# Patient Record
Sex: Female | Born: 1985 | Race: Black or African American | Hispanic: No | Marital: Married | Smoking: Never smoker
Health system: Southern US, Community
[De-identification: ages and names within clinical notes are randomized; demographics above are authoritative.]

## PROBLEM LIST (undated history)

## (undated) ENCOUNTER — Inpatient Hospital Stay (HOSPITAL_COMMUNITY): Payer: Self-pay

## (undated) DIAGNOSIS — D219 Benign neoplasm of connective and other soft tissue, unspecified: Secondary | ICD-10-CM

## (undated) HISTORY — PX: NO PAST SURGERIES: SHX2092

---
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
## 2013-02-20 NOTE — L&D Delivery Note (Signed)
Pt. Progressed to full dilation after AROM and pitocin augmentation.  Fetal bradycardia at the beginning of pushing resolved.  NSVD of a viable female, LOA over intact perineum.  Loose nuchal cord released over head. Shoulders delivered with gentle traction.  Meconium in fluid noted.  Neonate handed over to NICU team quickly after cord being cut.  Cord PH and cord blood collected.  Placenta delivered with gentle traction, appears grossly normal with 3 vessel cord. Left labial minora laceration repaired with 3-0 vicryl.  No other lacerations noted.  Neonate taken to NICU in stable condition, Apgars 3,7.  EBL 400cc. Pitocin given after placenta delivery.  Mother stable in labor room after delivery.  Dr. Alesia Richards.

## 2013-03-12 ENCOUNTER — Ambulatory Visit: Payer: Self-pay | Admitting: Obstetrics & Gynecology

## 2013-03-13 ENCOUNTER — Ambulatory Visit: Payer: Self-pay | Admitting: Obstetrics

## 2013-03-31 ENCOUNTER — Encounter: Payer: Self-pay | Admitting: Obstetrics & Gynecology

## 2013-03-31 ENCOUNTER — Ambulatory Visit: Payer: Self-pay | Admitting: Obstetrics & Gynecology

## 2013-03-31 ENCOUNTER — Ambulatory Visit (INDEPENDENT_AMBULATORY_CARE_PROVIDER_SITE_OTHER): Payer: BC Managed Care – PPO | Admitting: Obstetrics & Gynecology

## 2013-03-31 VITALS — BP 138/80 | HR 75 | Temp 98.4°F | Ht 62.0 in | Wt 186.0 lb

## 2013-03-31 DIAGNOSIS — N926 Irregular menstruation, unspecified: Secondary | ICD-10-CM

## 2013-03-31 LAB — POCT URINE PREGNANCY: Preg Test, Ur: POSITIVE

## 2013-03-31 NOTE — Progress Notes (Signed)
Subjective:     Stephanie Park is a 28 y.o. female here for a routine exam.  Current complaints: Patient in office today for a problem visit. Patient states her last period was on December 12, 2012. Patient states she had some spotting Sunday, Tuesday, and Friday. Patient states periods are usually regular. Patient states period came twice in October once on the 6th and then again on the 23. On the 6th it only lasted 3 days and then on the 23rd which is when her normal periods usually come it was alittle longer then normal lasting 5-6 days. Patient states she would like to talk about preparing for pregnancy.  Personal health questionnaire reviewed: yes.   Gynecologic History Patient's last menstrual period was 12/12/2012. Contraception: none Last Pap: 2013. Results were: normal  Obstetric History OB History  Gravida Para Term Preterm AB SAB TAB Ectopic Multiple Living  0 0 0 0 0 0 0 0 0 0          The following portions of the patient's history were reviewed and updated as appropriate: allergies, current medications, past family history, past medical history, past social history, past surgical history and problem list.  Review of Systems Pertinent items are noted in HPI.    Objective:     Exam deferred     Assessment:   Early pregnant  Plan:   PNV Orders Placed This Encounter  Procedures  . hCG, quantitative, pregnancy  . POCT urine pregnancy  Return to start prenatal care

## 2013-04-01 LAB — HCG, QUANTITATIVE, PREGNANCY: HCG, BETA CHAIN, QUANT, S: 19174.5 m[IU]/mL

## 2013-04-07 ENCOUNTER — Other Ambulatory Visit: Payer: Self-pay | Admitting: *Deleted

## 2013-04-07 DIAGNOSIS — O3680X0 Pregnancy with inconclusive fetal viability, not applicable or unspecified: Secondary | ICD-10-CM

## 2013-04-08 ENCOUNTER — Other Ambulatory Visit: Payer: BC Managed Care – PPO

## 2013-04-11 ENCOUNTER — Ambulatory Visit (HOSPITAL_COMMUNITY)
Admission: RE | Admit: 2013-04-11 | Discharge: 2013-04-11 | Disposition: A | Discharge status: Home / Self Care | Payer: BC Managed Care – PPO | Source: Ambulatory Visit | Attending: Obstetrics & Gynecology | Admitting: Obstetrics & Gynecology

## 2013-04-11 DIAGNOSIS — O208 Other hemorrhage in early pregnancy: Secondary | ICD-10-CM | POA: Insufficient documentation

## 2013-04-11 DIAGNOSIS — O3680X0 Pregnancy with inconclusive fetal viability, not applicable or unspecified: Secondary | ICD-10-CM

## 2013-04-11 DIAGNOSIS — Z3689 Encounter for other specified antenatal screening: Secondary | ICD-10-CM | POA: Insufficient documentation

## 2013-04-28 ENCOUNTER — Encounter: Payer: Self-pay | Admitting: Obstetrics & Gynecology

## 2013-04-28 ENCOUNTER — Ambulatory Visit (INDEPENDENT_AMBULATORY_CARE_PROVIDER_SITE_OTHER): Payer: BC Managed Care – PPO | Admitting: Obstetrics & Gynecology

## 2013-04-28 VITALS — BP 118/79 | Temp 98.3°F | Wt 195.0 lb

## 2013-04-28 DIAGNOSIS — Z3201 Encounter for pregnancy test, result positive: Secondary | ICD-10-CM

## 2013-04-28 DIAGNOSIS — O26899 Other specified pregnancy related conditions, unspecified trimester: Secondary | ICD-10-CM

## 2013-04-28 DIAGNOSIS — D252 Subserosal leiomyoma of uterus: Secondary | ICD-10-CM

## 2013-04-28 DIAGNOSIS — R519 Headache, unspecified: Secondary | ICD-10-CM

## 2013-04-28 DIAGNOSIS — R51 Headache: Secondary | ICD-10-CM

## 2013-04-28 DIAGNOSIS — Z3400 Encounter for supervision of normal first pregnancy, unspecified trimester: Secondary | ICD-10-CM

## 2013-04-28 LAB — OB RESULTS CONSOLE GC/CHLAMYDIA
Chlamydia: NEGATIVE
Gonorrhea: NEGATIVE

## 2013-04-28 LAB — POCT URINALYSIS DIPSTICK
BILIRUBIN UA: NEGATIVE
GLUCOSE UA: NEGATIVE
Ketones, UA: NEGATIVE
Leukocytes, UA: NEGATIVE
Nitrite, UA: NEGATIVE
Protein, UA: NEGATIVE
RBC UA: NEGATIVE
UROBILINOGEN UA: NEGATIVE
pH, UA: 8

## 2013-04-28 MED ORDER — BUTALBITAL-APAP-CAFFEINE 50-325-40 MG PO TABS: 1.0000 | ORAL_TABLET | Freq: Four times a day (QID) | ORAL | Status: DC | PRN

## 2013-04-28 NOTE — Addendum Note (Signed)
Addended by: Lewie Loron D on: 04/28/2013 04:14 PM   Modules accepted: Orders

## 2013-04-28 NOTE — Patient Instructions (Signed)
Pregnancy - First Trimester  During sexual intercourse, millions of sperm go into the vagina. Only 1 sperm will penetrate and fertilize the female egg while it is in the Fallopian tube. One week later, the fertilized egg implants into the wall of the uterus. An embryo begins to develop into a baby. At 6 to 8 weeks, the eyes and face are formed and the heartbeat can be seen on ultrasound. At the end of 12 weeks (first trimester), all the baby's organs are formed. Now that you are pregnant, you will want to do everything you can to have a healthy baby. Two of the most important things are to get good prenatal care and follow your caregiver's instructions. Prenatal care is all the medical care you receive before the baby's birth. It is given to prevent, find, and treat problems during the pregnancy and childbirth.  PRENATAL EXAMS  · During prenatal visits, your weight, blood pressure, and urine are checked. This is done to make sure you are healthy and progressing normally during the pregnancy.  · A pregnant woman should gain 25 to 35 pounds during the pregnancy. However, if you are overweight or underweight, your caregiver will advise you regarding your weight.  · Your caregiver will ask and answer questions for you.  · Blood work, cervical cultures, other necessary tests, and a Pap test are done during your prenatal exams. These tests are done to check on your health and the probable health of your baby. Tests are strongly recommended and done for HIV with your permission. This is the virus that causes AIDS. These tests are done because medicines can be given to help prevent your baby from being born with this infection should you have been infected without knowing it. Blood work is also used to find out your blood type, previous infections, and follow your blood levels (hemoglobin).  · Low hemoglobin (anemia) is common during pregnancy. Iron and vitamins are given to help prevent this. Later in the pregnancy, blood  tests for diabetes will be done along with any other tests if any problems develop.  · You may need other tests to make sure you and the baby are doing well.  CHANGES DURING THE FIRST TRIMESTER   Your body goes through many changes during pregnancy. They vary from person to person. Talk to your caregiver about changes you notice and are concerned about. Changes can include:  · Your menstrual period stops.  · The egg and sperm carry the genes that determine what you look like. Genes from you and your partner are forming a baby. The female genes determine whether the baby is a boy or a girl.  · Your body increases in girth and you may feel bloated.  · Feeling sick to your stomach (nauseous) and throwing up (vomiting). If the vomiting is uncontrollable, call your caregiver.  · Your breasts will begin to enlarge and become tender.  · Your nipples may stick out more and become darker.  · The need to urinate more. Painful urination may mean you have a bladder infection.  · Tiring easily.  · Loss of appetite.  · Cravings for certain kinds of food.  · At first, you may gain or lose a couple of pounds.  · You may have changes in your emotions from day to day (excited to be pregnant or concerned something may go wrong with the pregnancy and baby).  · You may have more vivid and strange dreams.  HOME CARE INSTRUCTIONS   ·   It is very important to avoid all smoking, alcohol and non-prescribed drugs during your pregnancy. These affect the formation and growth of the baby. Avoid chemicals while pregnant to ensure the delivery of a healthy infant.  · Start your prenatal visits by the 12th week of pregnancy. They are usually scheduled monthly at first, then more often in the last 2 months before delivery. Keep your caregiver's appointments. Follow your caregiver's instructions regarding medicine use, blood and lab tests, exercise, and diet.  · During pregnancy, you are providing food for you and your baby. Eat regular, well-balanced  meals. Choose foods such as meat, fish, milk and other low fat dairy products, vegetables, fruits, and whole-grain breads and cereals. Your caregiver will tell you of the ideal weight gain.  · You can help morning sickness by keeping soda crackers at the bedside. Eat a couple before arising in the morning. You may want to use the crackers without salt on them.  · Eating 4 to 5 small meals rather than 3 large meals a day also may help the nausea and vomiting.  · Drinking liquids between meals instead of during meals also seems to help nausea and vomiting.  · A physical sexual relationship may be continued throughout pregnancy if there are no other problems. Problems may be early (premature) leaking of amniotic fluid from the membranes, vaginal bleeding, or belly (abdominal) pain.  · Exercise regularly if there are no restrictions. Check with your caregiver or physical therapist if you are unsure of the safety of some of your exercises. Greater weight gain will occur in the last 2 trimesters of pregnancy. Exercising will help:  · Control your weight.  · Keep you in shape.  · Prepare you for labor and delivery.  · Help you lose your pregnancy weight after you deliver your baby.  · Wear a good support or jogging bra for breast tenderness during pregnancy. This may help if worn during sleep too.  · Ask when prenatal classes are available. Begin classes when they are offered.  · Do not use hot tubs, steam rooms, or saunas.  · Wear your seat belt when driving. This protects you and your baby if you are in an accident.  · Avoid raw meat, uncooked cheese, cat litter boxes, and soil used by cats throughout the pregnancy. These carry germs that can cause birth defects in the baby.  · The first trimester is a good time to visit your dentist for your dental health. Getting your teeth cleaned is okay. Use a softer toothbrush and brush gently during pregnancy.  · Ask for help if you have financial, counseling, or nutritional needs  during pregnancy. Your caregiver will be able to offer counseling for these needs as well as refer you for other special needs.  · Do not take any medicines or herbs unless told by your caregiver.  · Inform your caregiver if there is any mental or physical domestic violence.  · Make a list of emergency phone numbers of family, friends, hospital, and police and fire departments.  · Write down your questions. Take them to your prenatal visit.  · Do not douche.  · Do not cross your legs.  · If you have to stand for long periods of time, rotate you feet or take small steps in a circle.  · You may have more vaginal secretions that may require a sanitary pad. Do not use tampons or scented sanitary pads.  MEDICINES AND DRUG USE IN PREGNANCY  ·   Take prenatal vitamins as directed. The vitamin should contain 1 milligram of folic acid. Keep all vitamins out of reach of children. Only a couple vitamins or tablets containing iron may be fatal to a baby or young child when ingested.  · Avoid use of all medicines, including herbs, over-the-counter medicines, not prescribed or suggested by your caregiver. Only take over-the-counter or prescription medicines for pain, discomfort, or fever as directed by your caregiver. Do not use aspirin, ibuprofen, or naproxen unless directed by your caregiver.  · Let your caregiver also know about herbs you may be using.  · Alcohol is related to a number of birth defects. This includes fetal alcohol syndrome. All alcohol, in any form, should be avoided completely. Smoking will cause low birth rate and premature babies.  · Street or illegal drugs are very harmful to the baby. They are absolutely forbidden. A baby born to an addicted mother will be addicted at birth. The baby will go through the same withdrawal an adult does.  · Let your caregiver know about any medicines that you have to take and for what reason you take them.  SEEK MEDICAL CARE IF:   You have any concerns or worries during your  pregnancy. It is better to call with your questions if you feel they cannot wait, rather than worry about them.  SEEK IMMEDIATE MEDICAL CARE IF:   · An unexplained oral temperature above 102° F (38.9° C) develops, or as your caregiver suggests.  · You have leaking of fluid from the vagina (birth canal). If leaking membranes are suspected, take your temperature and inform your caregiver of this when you call.  · There is vaginal spotting or bleeding. Notify your caregiver of the amount and how many pads are used.  · You develop a bad smelling vaginal discharge with a change in the color.  · You continue to feel sick to your stomach (nauseated) and have no relief from remedies suggested. You vomit blood or coffee ground-like materials.  · You lose more than 2 pounds of weight in 1 week.  · You gain more than 2 pounds of weight in 1 week and you notice swelling of your face, hands, feet, or legs.  · You gain 5 pounds or more in 1 week (even if you do not have swelling of your hands, face, legs, or feet).  · You get exposed to German measles and have never had them.  · You are exposed to fifth disease or chickenpox.  · You develop belly (abdominal) pain. Round ligament discomfort is a common non-cancerous (benign) cause of abdominal pain in pregnancy. Your caregiver still must evaluate this.  · You develop headache, fever, diarrhea, pain with urination, or shortness of breath.  · You fall or are in a car accident or have any kind of trauma.  · There is mental or physical violence in your home.  Document Released: 01/31/2001 Document Revised: 11/01/2011 Document Reviewed: 08/04/2008  Exit[employer]® Patient Information ©2014 [employer].

## 2013-04-28 NOTE — Progress Notes (Signed)
Subjective:    Stephanie Park is being seen today for her first obstetrical visit.  This is a planned pregnancy. She is at  [redacted]w[redacted]d gestation. Her obstetrical history is significant for This is the Patients first pregnancy. . Relationship with FOB: spouse, living together. Patient does intend to breast feed. Pregnancy history fully reviewed. Patient states she had an ultrasound done almost 3 weeks ago and would like to know the results and how far along she is. Patient states she has been having constant headaches almost everyday. Patient states that off and on she will having some cramping pain and bloating.  Pulse: 89  Menstrual History: OB History   Grav Para Term Preterm Abortions TAB SAB Ect Mult Living   1 0 0 0 0 0 0 0 0 0       Menarche age: 39  Patient's last menstrual period was 12/12/2012.    The following portions of the patient's history were reviewed and updated as appropriate: allergies, current medications, past family history, past medical history, past social history, past surgical history and problem list.  Review of Systems Pertinent items are noted in HPI.    Objective:      General Appearance:    Alert, cooperative, no distress, appears stated age  Head:    Normocephalic, without obvious abnormality, atraumatic  Eyes:    PERRL, conjunctiva/corneas clear, EOM's intact, fundi    benign, both eyes  Ears:    Normal TM's and external ear canals, both ears  Nose:   Nares normal, septum midline, mucosa normal, no drainage    or sinus tenderness  Throat:   Lips, mucosa, and tongue normal; teeth and gums normal  Neck:   Supple, symmetrical, trachea midline, no adenopathy;    thyroid:  no enlargement/tenderness/nodules; no carotid   bruit or JVD  Back:     Symmetric, no curvature, ROM normal, no CVA tenderness  Lungs:     Clear to auscultation bilaterally, respirations unlabored  Chest Wall:    No tenderness or deformity   Heart:    Regular rate and rhythm, S1 and S2  normal, no murmur, rub   or gallop  Breast Exam:    No tenderness, masses, or nipple abnormality  Abdomen:     Soft, non-tender, bowel sounds active all four quadrants,    no masses, no organomegaly  Genitalia:    Borderline clitoral index/clitorimegaly, discharge or tenderness  Extremities:   Extremities normal, atraumatic, no cyanosis or edema  Pulses:   2+ and symmetric all extremities  Skin:   Skin color, texture, turgor normal, no rashes or lesions  Lymph nodes:   Cervical, supraclavicular, and axillary nodes normal  Neurologic:   CNII-XII intact, normal strength, sensation and reflexes    throughout       U/S w/cardiac activity Assessment:    Pregnancy at [redacted]w[redacted]d weeks    Plan:    Initial labs drawn. Prenatal vitamins.  Counseling provided regarding continued use of seat belts, cessation of alcohol consumption, smoking or use of illicit drugs; infection precautions i.e., influenza/TDAP immunizations, toxoplasmosis,CMV, parvovirus, listeria and varicella; workplace safety, exercise during pregnancy; routine dental care, safe medications, sexual activity, hot tubs, saunas, pools, travel, caffeine use, fish and methlymercury, potential toxins, hair treatments, varicose veins Weight gain recommendations per IOM guidelines reviewed: obese/BMI >30->gain  11 - 20 lbs Problem list reviewed and updated. FIRST/CF mutation testing discussed. Role of ultrasound in pregnancy discussed; fetal survey Amniocentesis discussed: not indicated. Follow up in 4 weeks. 50%  of 20 min visit spent on counseling and coordination of care.

## 2013-04-29 LAB — OBSTETRIC PANEL
Antibody Screen: NEGATIVE
Basophils Absolute: 0 10*3/uL (ref 0.0–0.1)
Basophils Relative: 0 % (ref 0–1)
Eosinophils Absolute: 0.2 10*3/uL (ref 0.0–0.7)
Eosinophils Relative: 2 % (ref 0–5)
HCT: 41.5 % (ref 36.0–46.0)
Hemoglobin: 14.2 g/dL (ref 12.0–15.0)
Hepatitis B Surface Ag: NEGATIVE
Lymphocytes Relative: 23 % (ref 12–46)
Lymphs Abs: 2.2 10*3/uL (ref 0.7–4.0)
MCH: 31.5 pg (ref 26.0–34.0)
MCHC: 34.2 g/dL (ref 30.0–36.0)
MCV: 92 fL (ref 78.0–100.0)
Monocytes Absolute: 0.4 10*3/uL (ref 0.1–1.0)
Monocytes Relative: 4 % (ref 3–12)
Neutro Abs: 6.7 10*3/uL (ref 1.7–7.7)
Neutrophils Relative %: 71 % (ref 43–77)
Platelets: 241 10*3/uL (ref 150–400)
RBC: 4.51 MIL/uL (ref 3.87–5.11)
RDW: 14.4 % (ref 11.5–15.5)
Rh Type: POSITIVE
Rubella: 24.5 Index — ABNORMAL HIGH (ref <0.90)
WBC: 9.4 10*3/uL (ref 4.0–10.5)

## 2013-04-29 LAB — GC/CHLAMYDIA PROBE AMP
CT Probe RNA: NEGATIVE
GC Probe RNA: NEGATIVE

## 2013-04-29 LAB — VITAMIN D 25 HYDROXY (VIT D DEFICIENCY, FRACTURES): Vit D, 25-Hydroxy: 24 ng/mL — ABNORMAL LOW (ref 30–89)

## 2013-04-29 LAB — VARICELLA ZOSTER ANTIBODY, IGG: Varicella IgG: 23.68 Index (ref <135.00)

## 2013-04-29 LAB — HIV ANTIBODY (ROUTINE TESTING W REFLEX): HIV: NONREACTIVE

## 2013-04-30 LAB — HEMOGLOBINOPATHY EVALUATION
HGB S QUANTITAION: 0 %
Hemoglobin Other: 0 %
Hgb A2 Quant: 2.7 % (ref 2.2–3.2)
Hgb A: 96.9 % (ref 96.8–97.8)
Hgb F Quant: 0.4 % (ref 0.0–2.0)

## 2013-04-30 LAB — CULTURE, OB URINE
COLONY COUNT: NO GROWTH
ORGANISM ID, BACTERIA: NO GROWTH

## 2013-06-02 ENCOUNTER — Encounter: Payer: Self-pay | Admitting: Obstetrics & Gynecology

## 2013-06-02 ENCOUNTER — Ambulatory Visit (INDEPENDENT_AMBULATORY_CARE_PROVIDER_SITE_OTHER): Payer: BC Managed Care – PPO | Admitting: Obstetrics & Gynecology

## 2013-06-02 VITALS — BP 118/77 | Temp 97.4°F | Wt 194.0 lb

## 2013-06-02 DIAGNOSIS — Z3400 Encounter for supervision of normal first pregnancy, unspecified trimester: Secondary | ICD-10-CM

## 2013-06-02 LAB — POCT URINALYSIS DIPSTICK
Bilirubin, UA: NEGATIVE
Glucose, UA: NEGATIVE
KETONES UA: NEGATIVE
Leukocytes, UA: NEGATIVE
Nitrite, UA: NEGATIVE
PH UA: 5
RBC UA: NEGATIVE
Spec Grav, UA: 1.025
Urobilinogen, UA: NEGATIVE

## 2013-06-02 NOTE — Progress Notes (Signed)
Pulse 102, c/o daily for headaches for the past 2 days not relieved by fioricet.

## 2013-06-03 ENCOUNTER — Other Ambulatory Visit: Payer: Self-pay | Admitting: *Deleted

## 2013-06-03 DIAGNOSIS — Z3400 Encounter for supervision of normal first pregnancy, unspecified trimester: Secondary | ICD-10-CM

## 2013-06-03 LAB — CYSTIC FIBROSIS DIAGNOSTIC STUDY

## 2013-06-03 LAB — AFP, QUAD SCREEN
AFP: 18.1 IU/mL
Curr Gest Age: 15.1 wks.days
HCG, Total: 34402 m[IU]/mL
INH: 200.6 pg/mL
Interpretation-AFP: NEGATIVE
MOM FOR HCG: 1.32
MoM for AFP: 0.69
MoM for INH: 1.12
Open Spina bifida: NEGATIVE
TRI 18 SCR RISK EST: NEGATIVE
UE3 VALUE: 0.3 ng/mL
uE3 Mom: 0.99

## 2013-06-08 ENCOUNTER — Encounter: Payer: Self-pay | Admitting: Obstetrics & Gynecology

## 2013-06-23 ENCOUNTER — Other Ambulatory Visit: Payer: Self-pay | Admitting: Obstetrics & Gynecology

## 2013-06-23 ENCOUNTER — Ambulatory Visit (HOSPITAL_COMMUNITY)
Admission: RE | Admit: 2013-06-23 | Discharge: 2013-06-23 | Disposition: A | Discharge status: Home / Self Care | Payer: BC Managed Care – PPO | Source: Ambulatory Visit | Attending: Obstetrics & Gynecology | Admitting: Obstetrics & Gynecology

## 2013-06-23 DIAGNOSIS — Z3400 Encounter for supervision of normal first pregnancy, unspecified trimester: Secondary | ICD-10-CM

## 2013-06-23 DIAGNOSIS — Z3689 Encounter for other specified antenatal screening: Secondary | ICD-10-CM | POA: Insufficient documentation

## 2013-06-29 ENCOUNTER — Encounter: Payer: Self-pay | Admitting: Obstetrics & Gynecology

## 2013-06-30 ENCOUNTER — Encounter: Payer: BC Managed Care – PPO | Admitting: Obstetrics & Gynecology

## 2013-07-03 ENCOUNTER — Encounter (HOSPITAL_COMMUNITY): Payer: Self-pay | Admitting: *Deleted

## 2013-07-03 ENCOUNTER — Inpatient Hospital Stay (HOSPITAL_COMMUNITY)
Admission: AD | Admit: 2013-07-03 | Discharge: 2013-07-03 | Disposition: A | Discharge status: Home / Self Care | Payer: BC Managed Care – PPO | Source: Ambulatory Visit | Attending: Obstetrics | Admitting: Obstetrics

## 2013-07-03 DIAGNOSIS — R51 Headache: Secondary | ICD-10-CM | POA: Insufficient documentation

## 2013-07-03 DIAGNOSIS — O210 Mild hyperemesis gravidarum: Secondary | ICD-10-CM | POA: Insufficient documentation

## 2013-07-03 DIAGNOSIS — O9989 Other specified diseases and conditions complicating pregnancy, childbirth and the puerperium: Principal | ICD-10-CM

## 2013-07-03 DIAGNOSIS — G43909 Migraine, unspecified, not intractable, without status migrainosus: Secondary | ICD-10-CM

## 2013-07-03 DIAGNOSIS — D252 Subserosal leiomyoma of uterus: Secondary | ICD-10-CM

## 2013-07-03 DIAGNOSIS — O99891 Other specified diseases and conditions complicating pregnancy: Secondary | ICD-10-CM | POA: Insufficient documentation

## 2013-07-03 DIAGNOSIS — Z3400 Encounter for supervision of normal first pregnancy, unspecified trimester: Secondary | ICD-10-CM

## 2013-07-03 DIAGNOSIS — R42 Dizziness and giddiness: Secondary | ICD-10-CM | POA: Insufficient documentation

## 2013-07-03 LAB — URINALYSIS, ROUTINE W REFLEX MICROSCOPIC
Bilirubin Urine: NEGATIVE
Glucose, UA: NEGATIVE mg/dL
HGB URINE DIPSTICK: NEGATIVE
KETONES UR: NEGATIVE mg/dL
Leukocytes, UA: NEGATIVE
Nitrite: NEGATIVE
PH: 7.5 (ref 5.0–8.0)
Protein, ur: NEGATIVE mg/dL
SPECIFIC GRAVITY, URINE: 1.01 (ref 1.005–1.030)
Urobilinogen, UA: 0.2 mg/dL (ref 0.0–1.0)

## 2013-07-03 MED ORDER — METOCLOPRAMIDE HCL 10 MG PO TABS: 10.0000 mg | ORAL_TABLET | Freq: Once | ORAL | Status: DC

## 2013-07-03 MED ORDER — ONDANSETRON 4 MG PO TBDP: 4.0000 mg | ORAL_TABLET | Freq: Four times a day (QID) | ORAL | Status: DC | PRN

## 2013-07-03 MED ORDER — OXYCODONE-ACETAMINOPHEN 5-325 MG PO TABS
1.0000 | ORAL_TABLET | Freq: Once | ORAL | Status: AC
  Administered 2013-07-03: 1 via ORAL
  Filled 2013-07-03: qty 1

## 2013-07-03 MED ORDER — ONDANSETRON 8 MG PO TBDP
8.0000 mg | ORAL_TABLET | Freq: Once | ORAL | Status: AC
Start: 2013-07-03 — End: 2013-07-03
  Administered 2013-07-03: 8 mg via ORAL
  Filled 2013-07-03: qty 1

## 2013-07-03 NOTE — MAU Note (Signed)
Pt reports she starte having a headache this morning. Then she started having nausea and feeling dizzy. Pt has had headache before. Taking fiorecet For headache did not take it today because of the nauesa.

## 2013-07-03 NOTE — MAU Provider Note (Signed)
History     CSN: [id number]  Arrival date and time: 07/03/13 1250   None     Chief Complaint  Patient presents with  . Headache  . Dizziness  . Nausea   HPI This is a 28 y.o. female at [redacted]w[redacted]d who presents with c/o headache, nausea, dizziness and malaise. Did not try any thing for these symptoms. Has a history of headaches, not sure if they are migraines.   RN Note:  Pt reports she starte having a headache this morning. Then she started having nausea and feeling dizzy. Pt has had headache before. Taking fiorecet For headache did not take it today because of the nauesa.       OB History   Grav Para Term Preterm Abortions TAB SAB Ect Mult Living   1 0 0 0 0 0 0 0 0 0       Past Medical History  Diagnosis Date  . MPNTIRWE(315.4)     Past Surgical History  Procedure Laterality Date  . No past surgeries      Family History  Problem Relation Age of Onset  . Kidney disease Father   . Hypertension Father   . Cancer Neg Hx   . Diabetes Maternal Grandmother   . Heart disease Neg Hx     History  Substance Use Topics  . Smoking status: Never Smoker   . Smokeless tobacco: Never Used  . Alcohol Use: No    Allergies: No Known Allergies  Prescriptions prior to admission  Medication Sig Dispense Refill  . butalbital-acetaminophen-caffeine (FIORICET) 50-325-40 MG per tablet Take 1-2 tablets by mouth every 6 (six) hours as needed for headache.  60 tablet  1  . MAGNESIUM PO Take 1 tablet by mouth daily.      . Prenatal Vit-Fe Fumarate-FA (PRENATABS FA) TABS Take by mouth.        Review of Systems  Constitutional: Positive for malaise/fatigue. Negative for fever and chills.  Gastrointestinal: Positive for nausea. Negative for vomiting, abdominal pain, diarrhea and constipation.  Musculoskeletal: Negative for myalgias.  Neurological: Positive for dizziness and headaches. Negative for weakness.   Physical Exam   Blood pressure 124/73, pulse 113, temperature 98.7 F  (37.1 C), resp. rate 18, height 5\' 2"  (1.575 m), weight 88.542 kg (195 lb 3.2 oz), last menstrual period 12/12/2012.  Physical Exam  Constitutional: She is oriented to person, place, and time. She appears well-developed and well-nourished. No distress.  HENT:  Head: Normocephalic.  Neck: Normal range of motion. Neck supple.  Cardiovascular: Normal rate.  Exam reveals no gallop and no friction rub.   No murmur heard. Respiratory: Effort normal.  GI: Soft. There is no tenderness. There is no rebound and no guarding.  Musculoskeletal: Normal range of motion.  Neurological: She is alert and oriented to person, place, and time.  Skin: Skin is warm and dry.  Psychiatric: She has a normal mood and affect.    MAU Course  Procedures  MDM Will try Zofran and one percocet.  Got good relief from meds  Assessment and Plan  A:  SIUP at [redacted]w[redacted]d       Probable Migraine      Improved headache and nausea  P;  Discharge home          Medication List         butalbital-acetaminophen-caffeine 50-325-40 MG per tablet  Commonly known as:  FIORICET  Take 1-2 tablets by mouth every 6 (six) hours as needed for headache.  MAGNESIUM PO  Take 1 tablet by mouth daily.     ondansetron 4 MG disintegrating tablet  Commonly known as:  ZOFRAN ODT  Take 1 tablet (4 mg total) by mouth every 6 (six) hours as needed for nausea.     PRENATABS FA Tabs  Take by mouth.             Follow up in office  Seabron Spates 07/03/2013, 1:54 PM

## 2013-07-03 NOTE — Discharge Instructions (Signed)
Migraine Headache A migraine headache is an intense, throbbing pain on one or both sides of your head. A migraine can last for 30 minutes to several hours. CAUSES  The exact cause of a migraine headache is not always known. However, a migraine may be caused when nerves in the brain become irritated and release chemicals that cause inflammation. This causes pain. Certain things may also trigger migraines, such as:  Alcohol.  Smoking.  Stress.  Menstruation.  Aged cheeses.  Foods or drinks that contain nitrates, glutamate, aspartame, or tyramine.  Lack of sleep.  Chocolate.  Caffeine.  Hunger.  Physical exertion.  Fatigue.  Medicines used to treat chest pain (nitroglycerine), birth control pills, estrogen, and some blood pressure medicines. SIGNS AND SYMPTOMS  Pain on one or both sides of your head.  Pulsating or throbbing pain.  Severe pain that prevents daily activities.  Pain that is aggravated by any physical activity.  Nausea, vomiting, or both.  Dizziness.  Pain with exposure to bright lights, loud noises, or activity.  General sensitivity to bright lights, loud noises, or smells. Before you get a migraine, you may get warning signs that a migraine is coming (aura). An aura may include:  Seeing flashing lights.  Seeing bright spots, halos, or zig-zag lines.  Having tunnel vision or blurred vision.  Having feelings of numbness or tingling.  Having trouble talking.  Having muscle weakness. DIAGNOSIS  A migraine headache is often diagnosed based on:  Symptoms.  Physical exam.  A CT scan or MRI of your head. These imaging tests cannot diagnose migraines, but they can help rule out other causes of headaches. TREATMENT Medicines may be given for pain and nausea. Medicines can also be given to help prevent recurrent migraines.  HOME CARE INSTRUCTIONS  Only take over-the-counter or prescription medicines for pain or discomfort as directed by your  health care provider. The use of long-term narcotics is not recommended.  Lie down in a dark, quiet room when you have a migraine.  Keep a journal to find out what may trigger your migraine headaches. For example, write down:  What you eat and drink.  How much sleep you get.  Any change to your diet or medicines.  Limit alcohol consumption.  Quit smoking if you smoke.  Get 7 9 hours of sleep, or as recommended by your health care provider.  Limit stress.  Keep lights dim if bright lights bother you and make your migraines worse. SEEK IMMEDIATE MEDICAL CARE IF:   Your migraine becomes severe.  You have a fever.  You have a stiff neck.  You have vision loss.  You have muscular weakness or loss of muscle control.  You start losing your balance or have trouble walking.  You feel faint or pass out.  You have severe symptoms that are different from your first symptoms. MAKE SURE YOU:   Understand these instructions.  Will watch your condition.  Will get help right away if you are not doing well or get worse. Document Released: 02/06/2005 Document Revised: 11/27/2012 Document Reviewed: 10/14/2012 [employer] Patient Information 2014 [employer].  

## 2013-07-15 ENCOUNTER — Encounter: Payer: BC Managed Care – PPO | Admitting: Advanced Practice Midwife

## 2013-10-03 ENCOUNTER — Encounter (HOSPITAL_COMMUNITY): Payer: Self-pay | Admitting: *Deleted

## 2013-10-03 ENCOUNTER — Inpatient Hospital Stay (HOSPITAL_COMMUNITY): Payer: BC Managed Care – PPO

## 2013-10-03 ENCOUNTER — Inpatient Hospital Stay (HOSPITAL_COMMUNITY)
Admission: AD | Admit: 2013-10-03 | Discharge: 2013-10-03 | Disposition: A | Discharge status: Home / Self Care | Payer: BC Managed Care – PPO | Source: Ambulatory Visit | Attending: Obstetrics and Gynecology | Admitting: Obstetrics and Gynecology

## 2013-10-03 DIAGNOSIS — O368190 Decreased fetal movements, unspecified trimester, not applicable or unspecified: Secondary | ICD-10-CM | POA: Diagnosis present

## 2013-10-03 DIAGNOSIS — D252 Subserosal leiomyoma of uterus: Secondary | ICD-10-CM

## 2013-10-03 DIAGNOSIS — O368130 Decreased fetal movements, third trimester, not applicable or unspecified: Secondary | ICD-10-CM

## 2013-10-03 DIAGNOSIS — Z3402 Encounter for supervision of normal first pregnancy, second trimester: Secondary | ICD-10-CM

## 2013-10-03 NOTE — MAU Note (Signed)
Sent from [place] office- had decel during NST today;

## 2013-10-03 NOTE — MAU Provider Note (Signed)
Addendum  [place] BPP 8/8, FHR 139 NST reactive and reassuring, FHR 135, + accell, no decel, moderate varibility  DC to home  PTL precaution Kick counts FU in the office at next schedule ROB   Mame Twombly, CNM, MSN 10/03/2013. 3:03 PM

## 2013-10-03 NOTE — Discharge Instructions (Signed)
Preterm Labor Information Preterm labor is when labor starts at less than 37 weeks of pregnancy. The normal length of a pregnancy is 39 to 41 weeks. CAUSES Often, there is no identifiable underlying cause as to why a woman goes into preterm labor. One of the most common known causes of preterm labor is infection. Infections of the uterus, cervix, vagina, amniotic sac, bladder, kidney, or even the lungs (pneumonia) can cause labor to start. Other suspected causes of preterm labor include:   Urogenital infections, such as yeast infections and bacterial vaginosis.   Uterine abnormalities (uterine shape, uterine septum, fibroids, or bleeding from the placenta).   A cervix that has been operated on (it may fail to stay closed).   Malformations in the fetus.   Multiple gestations (twins, triplets, and so on).   Breakage of the amniotic sac.  RISK FACTORS  Having a previous history of preterm labor.   Having premature rupture of membranes (PROM).   Having a placenta that covers the opening of the cervix (placenta previa).   Having a placenta that separates from the uterus (placental abruption).   Having a cervix that is too weak to hold the fetus in the uterus (incompetent cervix).   Having too much fluid in the amniotic sac (polyhydramnios).   Taking illegal drugs or smoking while pregnant.   Not gaining enough weight while pregnant.   Being younger than 84 and older than 27 years old.   Having a low socioeconomic status.   Being African American. SYMPTOMS Signs and symptoms of preterm labor include:   Menstrual-like cramps, abdominal pain, or back pain.  Uterine contractions that are regular, as frequent as six in an hour, regardless of their intensity (may be mild or painful).  Contractions that start on the top of the uterus and spread down to the lower abdomen and back.   A sense of increased pelvic pressure.   A watery or bloody mucus discharge that  comes from the vagina.  TREATMENT Depending on the length of the pregnancy and other circumstances, your health care provider may suggest bed rest. If necessary, there are medicines that can be given to stop contractions and to mature the fetal lungs. If labor happens before 34 weeks of pregnancy, a prolonged hospital stay may be recommended. Treatment depends on the condition of both you and the fetus.  WHAT SHOULD YOU DO IF YOU THINK YOU ARE IN PRETERM LABOR? Call your health care provider right away. You will need to go to the hospital to get checked immediately. HOW CAN YOU PREVENT PRETERM LABOR IN FUTURE PREGNANCIES? You should:   Stop smoking if you smoke.  Maintain healthy weight gain and avoid chemicals and drugs that are not necessary.  Be watchful for any type of infection.  Inform your health care provider if you have a known history of preterm labor. Document Released: 04/29/2003 Document Revised: 10/09/2012 Document Reviewed: 03/11/2012 [facility] Patient Information 2015 [place], [place]. This information is not intended to replace advice given to you by your health care provider. Make sure you discuss any questions you have with your health care provider. Fetal Movement Counts Patient Name: __________________________________________________ Patient Due Date: ____________________ Performing a fetal movement count is highly recommended in high-risk pregnancies, but it is good for every pregnant woman to do. Your health care provider may ask you to start counting fetal movements at 28 weeks of the pregnancy. Fetal movements often increase:  After eating a full meal.  After physical activity.  After  eating or drinking something sweet or cold.  At rest. Pay attention to when you feel the baby is most active. This will help you notice a pattern of your baby's sleep and wake cycles and what factors contribute to an increase in fetal movement. It is important to perform a fetal  movement count at the same time each day when your baby is normally most active.  HOW TO COUNT FETAL MOVEMENTS 1. Find a quiet and comfortable area to sit or lie down on your left side. Lying on your left side provides the best blood and oxygen circulation to your baby. 2. Write down the day and time on a sheet of paper or in a journal. 3. Start counting kicks, flutters, swishes, rolls, or jabs in a 2-hour period. You should feel at least 10 movements within 2 hours. 4. If you do not feel 10 movements in 2 hours, wait 2-3 hours and count again. Look for a change in the pattern or not enough counts in 2 hours. SEEK MEDICAL CARE IF:  You feel less than 10 counts in 2 hours, tried twice.  There is no movement in over an hour.  The pattern is changing or taking longer each day to reach 10 counts in 2 hours.  You feel the baby is not moving as he or she usually does. Date: ____________ Movements: ____________ Start time: ____________ Stephanie Park time: ____________  Date: ____________ Movements: ____________ Start time: ____________ Stephanie Park time: ____________ Date: ____________ Movements: ____________ Start time: ____________ Stephanie Park time: ____________ Date: ____________ Movements: ____________ Start time: ____________ Stephanie Park time: ____________ Date: ____________ Movements: ____________ Start time: ____________ Stephanie Park time: ____________ Date: ____________ Movements: ____________ Start time: ____________ Stephanie Park time: ____________ Date: ____________ Movements: ____________ Start time: ____________ Stephanie Park time: ____________ Date: ____________ Movements: ____________ Start time: ____________ Stephanie Park time: ____________  Date: ____________ Movements: ____________ Start time: ____________ Stephanie Park time: ____________ Date: ____________ Movements: ____________ Start time: ____________ Stephanie Park time: ____________ Date: ____________ Movements: ____________ Start time: ____________ Stephanie Park time: ____________ Date:  ____________ Movements: ____________ Start time: ____________ Stephanie Park time: ____________ Date: ____________ Movements: ____________ Start time: ____________ Stephanie Park time: ____________ Date: ____________ Movements: ____________ Start time: ____________ Stephanie Park time: ____________ Date: ____________ Movements: ____________ Start time: ____________ Stephanie Park time: ____________  Date: ____________ Movements: ____________ Start time: ____________ Stephanie Park time: ____________ Date: ____________ Movements: ____________ Start time: ____________ Stephanie Park time: ____________ Date: ____________ Movements: ____________ Start time: ____________ Stephanie Park time: ____________ Date: ____________ Movements: ____________ Start time: ____________ Stephanie Park time: ____________ Date: ____________ Movements: ____________ Start time: ____________ Stephanie Park time: ____________ Date: ____________ Movements: ____________ Start time: ____________ Stephanie Park time: ____________ Date: ____________ Movements: ____________ Start time: ____________ Stephanie Park time: ____________  Date: ____________ Movements: ____________ Start time: ____________ Stephanie Park time: ____________ Date: ____________ Movements: ____________ Start time: ____________ Stephanie Park time: ____________ Date: ____________ Movements: ____________ Start time: ____________ Stephanie Park time: ____________ Date: ____________ Movements: ____________ Start time: ____________ Stephanie Park time: ____________ Date: ____________ Movements: ____________ Start time: ____________ Stephanie Park time: ____________ Date: ____________ Movements: ____________ Start time: ____________ Stephanie Park time: ____________ Date: ____________ Movements: ____________ Start time: ____________ Stephanie Park time: ____________  Date: ____________ Movements: ____________ Start time: ____________ Stephanie Park time: ____________ Date: ____________ Movements: ____________ Start time: ____________ Stephanie Park time: ____________ Date: ____________ Movements: ____________ Start  time: ____________ Stephanie Park time: ____________ Date: ____________ Movements: ____________ Start time: ____________ Stephanie Park time: ____________ Date: ____________ Movements: ____________ Start time: ____________ Stephanie Park time: ____________ Date: ____________ Movements: ____________ Start time: ____________ Stephanie Park time: ____________ Date: ____________ Movements: ____________ Start time: ____________ Stephanie Park time: ____________  Date:  ____________ Movements: ____________ Start time: ____________ Finish time: ____________ °Date: ____________ Movements: ____________ Start time: ____________ Finish time: ____________ °Date: ____________ Movements: ____________ Start time: ____________ Finish time: ____________ °Date: ____________ Movements: ____________ Start time: ____________ Finish time: ____________ °Date: ____________ Movements: ____________ Start time: ____________ Finish time: ____________ °Date: ____________ Movements: ____________ Start time: ____________ Finish time: ____________ °Date: ____________ Movements: ____________ Start time: ____________ Finish time: ____________  °Date: ____________ Movements: ____________ Start time: ____________ Finish time: ____________ °Date: ____________ Movements: ____________ Start time: ____________ Finish time: ____________ °Date: ____________ Movements: ____________ Start time: ____________ Finish time: ____________ °Date: ____________ Movements: ____________ Start time: ____________ Finish time: ____________ °Date: ____________ Movements: ____________ Start time: ____________ Finish time: ____________ °Date: ____________ Movements: ____________ Start time: ____________ Finish time: ____________ °Date: ____________ Movements: ____________ Start time: ____________ Finish time: ____________  °Date: ____________ Movements: ____________ Start time: ____________ Finish time: ____________ °Date: ____________ Movements: ____________ Start time: ____________ Finish time:  ____________ °Date: ____________ Movements: ____________ Start time: ____________ Finish time: ____________ °Date: ____________ Movements: ____________ Start time: ____________ Finish time: ____________ °Date: ____________ Movements: ____________ Start time: ____________ Finish time: ____________ °Date: ____________ Movements: ____________ Start time: ____________ Finish time: ____________ °Document Released: 03/08/2006 Document Revised: 06/23/2013 Document Reviewed: 12/04/2011 °[employer]® Patient Information ©2015 [employer]. This information is not intended to replace advice given to you by your health care provider. Make sure you discuss any questions you have with your health care provider. ° °

## 2013-10-03 NOTE — MAU Provider Note (Signed)
Stephanie Park is a 28 y.o. G1P0 at 32.1 weeks, presents to MAU from the office c/o decrease fetal movement since yesterday.  She denies vb, lof or ctx    History     Patient Active Problem List   Diagnosis Date Noted  . Supervision of normal first pregnancy 04/28/2013  . Fibroids, subserous 04/28/2013    Chief Complaint  Patient presents with  . Decreased Fetal Movement   HPI  OB History   Grav Para Term Preterm Abortions TAB SAB Ect Mult Living   1 0 0 0 0 0 0 0 0 0       Past Medical History  Diagnosis Date  . XTAVWPVX(480.1)     Past Surgical History  Procedure Laterality Date  . No past surgeries      Family History  Problem Relation Age of Onset  . Kidney disease Father   . Hypertension Father   . Cancer Neg Hx   . Diabetes Maternal Grandmother   . Heart disease Neg Hx     History  Substance Use Topics  . Smoking status: Never Smoker   . Smokeless tobacco: Never Used  . Alcohol Use: No    Allergies: No Known Allergies  Prescriptions prior to admission  Medication Sig Dispense Refill  . ampicillin (PRINCIPEN) 500 MG capsule Take 500 mg by mouth 2 (two) times daily.      Marland Kitchen MAGNESIUM PO Take 1 tablet by mouth daily.      . Prenatal Vit-Fe Fumarate-FA (PRENATAL MULTIVITAMIN) TABS tablet Take 1 tablet by mouth daily at 12 noon.      . Triamcinolone Acetonide (NASACORT AQ NA) Place 1 spray into the nose daily as needed (for congestion).        ROS See HPI above, all other systems are negative  Physical Exam   Blood pressure 114/51, pulse 122, temperature 98.2 F (36.8 C), temperature source Oral, resp. rate 18, height 5\' 2"  (1.575 m), weight 199 lb (90.266 kg), last menstrual period 12/12/2012.  Physical Exam  Ext:  WNL ABD: Soft, non tender to palpation, no rebound or guarding SVE: deferred   ED Course  Assessment: IUP at  32.1weeks Membranes: intact FHR: Category 1 CTX:  None, irritibility noted  Plan: [place] BPP NST     Davaughn Hillyard, CNM, MSN 10/03/2013. 2:56 PM

## 2013-11-03 LAB — OB RESULTS CONSOLE GBS: GBS: NEGATIVE

## 2013-11-26 ENCOUNTER — Inpatient Hospital Stay (HOSPITAL_COMMUNITY)
Admission: AD | Admit: 2013-11-26 | Discharge: 2013-11-26 | Disposition: A | Discharge status: Home / Self Care | Payer: BC Managed Care – PPO | Source: Ambulatory Visit | Attending: Obstetrics and Gynecology | Admitting: Obstetrics and Gynecology

## 2013-11-26 ENCOUNTER — Encounter (HOSPITAL_COMMUNITY): Payer: Self-pay

## 2013-11-26 DIAGNOSIS — O0932 Supervision of pregnancy with insufficient antenatal care, second trimester: Secondary | ICD-10-CM | POA: Insufficient documentation

## 2013-11-26 DIAGNOSIS — R030 Elevated blood-pressure reading, without diagnosis of hypertension: Secondary | ICD-10-CM | POA: Insufficient documentation

## 2013-11-26 DIAGNOSIS — O99213 Obesity complicating pregnancy, third trimester: Secondary | ICD-10-CM | POA: Diagnosis not present

## 2013-11-26 DIAGNOSIS — O471 False labor at or after 37 completed weeks of gestation: Secondary | ICD-10-CM | POA: Insufficient documentation

## 2013-11-26 DIAGNOSIS — E669 Obesity, unspecified: Secondary | ICD-10-CM | POA: Diagnosis not present

## 2013-11-26 DIAGNOSIS — Z3A39 39 weeks gestation of pregnancy: Secondary | ICD-10-CM | POA: Diagnosis not present

## 2013-11-26 LAB — LACTATE DEHYDROGENASE: LDH: 160 U/L (ref 94–250)

## 2013-11-26 LAB — COMPREHENSIVE METABOLIC PANEL
ALT: 10 U/L (ref 0–35)
ANION GAP: 11 (ref 5–15)
AST: 13 U/L (ref 0–37)
Albumin: 2.9 g/dL — ABNORMAL LOW (ref 3.5–5.2)
Alkaline Phosphatase: 308 U/L — ABNORMAL HIGH (ref 39–117)
BUN: 6 mg/dL (ref 6–23)
CO2: 22 meq/L (ref 19–32)
CREATININE: 0.68 mg/dL (ref 0.50–1.10)
Calcium: 9.2 mg/dL (ref 8.4–10.5)
Chloride: 104 mEq/L (ref 96–112)
Glucose, Bld: 86 mg/dL (ref 70–99)
Potassium: 3.4 mEq/L — ABNORMAL LOW (ref 3.7–5.3)
Sodium: 137 mEq/L (ref 137–147)
Total Bilirubin: 0.3 mg/dL (ref 0.3–1.2)
Total Protein: 6.6 g/dL (ref 6.0–8.3)

## 2013-11-26 LAB — CBC
HCT: 38.3 % (ref 36.0–46.0)
Hemoglobin: 13 g/dL (ref 12.0–15.0)
MCH: 30.7 pg (ref 26.0–34.0)
MCHC: 33.9 g/dL (ref 30.0–36.0)
MCV: 90.3 fL (ref 78.0–100.0)
Platelets: 165 10*3/uL (ref 150–400)
RBC: 4.24 MIL/uL (ref 3.87–5.11)
RDW: 15.8 % — ABNORMAL HIGH (ref 11.5–15.5)
WBC: 6 10*3/uL (ref 4.0–10.5)

## 2013-11-26 LAB — PROTEIN / CREATININE RATIO, URINE
CREATININE, URINE: 48.43 mg/dL
Protein Creatinine Ratio: 0.09 (ref 0.00–0.15)
Total Protein, Urine: 4.2 mg/dL

## 2013-11-26 LAB — URIC ACID: Uric Acid, Serum: 6.6 mg/dL (ref 2.4–7.0)

## 2013-11-26 NOTE — MAU Provider Note (Signed)
History   28 yo G1P0 at 72 6/7 weeks presented after calling office with UCs q 5 min for 1-2 hours.  Denies leaking or bleeding, reports +FM.  Cervix was 3 cm per patient report at visit last week.  Denies HA, visual sx, or epigastric pain.  GBS negative.  Patient Active Problem List   Diagnosis Date Noted  . Obesity 11/26/2013  . Rubella non-immune status, antepartum 11/26/2013  . Late prenatal care--19 weeks 11/26/2013  . Fibroids, subserous 04/28/2013    Chief Complaint  Patient presents with  . Labor Eval   HPI:  As above  OB History   Grav Para Term Preterm Abortions TAB SAB Ect Mult Living   1 0 0 0 0 0 0 0 0 0       Past Medical History  Diagnosis Date  . JEHUDJSH(702.6)     Past Surgical History  Procedure Laterality Date  . No past surgeries      Family History  Problem Relation Age of Onset  . Kidney disease Father   . Hypertension Father   . Cancer Neg Hx   . Diabetes Maternal Grandmother   . Heart disease Neg Hx     History  Substance Use Topics  . Smoking status: Never Smoker   . Smokeless tobacco: Never Used  . Alcohol Use: No    Allergies: No Known Allergies  Prescriptions prior to admission  Medication Sig Dispense Refill  . MAGNESIUM PO Take 1 tablet by mouth daily.      . Prenatal Vit-Fe Fumarate-FA (PRENATAL MULTIVITAMIN) TABS tablet Take 1 tablet by mouth daily at 12 noon.        ROS:  Contractions, +FM Physical Exam   Blood pressure 152/88, pulse 87, temperature 98 F (36.7 C), temperature source Oral, resp. rate 18, height 5' 2.5" (1.588 m), weight 206 lb 9.6 oz (93.713 kg), last menstrual period 12/12/2012, SpO2 100.00%.  Physical Exam In NAD Chest clear Heart RRR without murmur Abd gravid, NT Pelvic--cervix loose 2 cm, 70%, vtx, -1 Ext DTR 2+, no clonus, 1+ edema  FHR Category 1 UCs irregular in quality and frequency.  ED Course  Assessment: IUP at 39 6/7 weeks Early vs prodromal labor Mildly elevated BP GBS  negative  Plan: PIH labs and PCR Observe labor status, recheck cervix in 1-2 hours.   Donnel Saxon CNM, MSN 11/26/2013 2:58 PM  Addendum: UCs same.  Patient walking about in room. Filed Vitals:   11/26/13 1317 11/26/13 1332 11/26/13 1435 11/26/13 1507  BP: 130/85 135/89 152/88 121/86  Pulse: 103 103 87 105  Temp:      TempSrc:      Resp:      Height:      Weight:      SpO2:       Results for orders placed during the hospital encounter of 11/26/13 (from the past 24 hour(s))  CBC     Status: Abnormal   Collection Time    11/26/13  2:15 PM      Result Value Ref Range   WBC 6.0  4.0 - 10.5 K/uL   RBC 4.24  3.87 - 5.11 MIL/uL   Hemoglobin 13.0  12.0 - 15.0 g/dL   HCT 38.3  36.0 - 46.0 %   MCV 90.3  78.0 - 100.0 fL   MCH 30.7  26.0 - 34.0 pg   MCHC 33.9  30.0 - 36.0 g/dL   RDW 15.8 (*) 11.5 - 15.5 %   Platelets 165  150 - 400 K/uL  COMPREHENSIVE METABOLIC PANEL     Status: Abnormal   Collection Time    11/26/13  2:15 PM      Result Value Ref Range   Sodium 137  137 - 147 mEq/L   Potassium 3.4 (*) 3.7 - 5.3 mEq/L   Chloride 104  96 - 112 mEq/L   CO2 22  19 - 32 mEq/L   Glucose, Bld 86  70 - 99 mg/dL   BUN 6  6 - 23 mg/dL   Creatinine, Ser 0.68  0.50 - 1.10 mg/dL   Calcium 9.2  8.4 - 10.5 mg/dL   Total Protein 6.6  6.0 - 8.3 g/dL   Albumin 2.9 (*) 3.5 - 5.2 g/dL   AST 13  0 - 37 U/L   ALT 10  0 - 35 U/L   Alkaline Phosphatase 308 (*) 39 - 117 U/L   Total Bilirubin 0.3  0.3 - 1.2 mg/dL   GFR calc non Af Amer >90  >90 mL/min   GFR calc Af Amer >90  >90 mL/min   Anion gap 11  5 - 15  LACTATE DEHYDROGENASE     Status: None   Collection Time    11/26/13  2:15 PM      Result Value Ref Range   LDH 160  94 - 250 U/L  URIC ACID     Status: None   Collection Time    11/26/13  2:15 PM      Result Value Ref Range   Uric Acid, Serum 6.6  2.4 - 7.0 mg/dL  PROTEIN / CREATININE RATIO, URINE     Status: None   Collection Time    11/26/13  2:36 PM      Result Value Ref  Range   Creatinine, Urine 48.43     Total Protein, Urine 4.2     Protein Creatinine Ratio 0.09  0.00 - 0.15   Cervix 2 cm, 80%, vtx, -1 (no change)  D/C'd home with labor and PIH precautions. Keep scheduled visit at [facility] on Monday, or prn.   Donnel Saxon, CNM 11/26/13 3:30p

## 2013-11-26 NOTE — Progress Notes (Signed)
Notified of pt arrival in MAU and two elevated BPs

## 2013-11-26 NOTE — Discharge Instructions (Signed)
Braxton Hicks Contractions °Contractions of the uterus can occur throughout pregnancy. Contractions are not always a sign that you are in labor.  °WHAT ARE BRAXTON HICKS CONTRACTIONS?  °Contractions that occur before labor are called Braxton Hicks contractions, or false labor. Toward the end of pregnancy (32-34 weeks), these contractions can develop more often and may become more forceful. This is not true labor because these contractions do not result in opening (dilatation) and thinning of the cervix. They are sometimes difficult to tell apart from true labor because these contractions can be forceful and people have different pain tolerances. You should not feel embarrassed if you go to the hospital with false labor. Sometimes, the only way to tell if you are in true labor is for your health care provider to look for changes in the cervix. °If there are no prenatal problems or other health problems associated with the pregnancy, it is completely safe to be sent home with false labor and await the onset of true labor. °HOW CAN YOU TELL THE DIFFERENCE BETWEEN TRUE AND FALSE LABOR? °False Labor °· The contractions of false labor are usually shorter and not as hard as those of true labor.   °· The contractions are usually irregular.   °· The contractions are often felt in the front of the lower abdomen and in the groin.   °· The contractions may go away when you walk around or change positions while lying down.   °· The contractions get weaker and are shorter lasting as time goes on.   °· The contractions do not usually become progressively stronger, regular, and closer together as with true labor.   °True Labor °· Contractions in true labor last 30-70 seconds, become very regular, usually become more intense, and increase in frequency.   °· The contractions do not go away with walking.   °· The discomfort is usually felt in the top of the uterus and spreads to the lower abdomen and low back.   °· True labor can be  determined by your health care provider with an exam. This will show that the cervix is dilating and getting thinner.   °WHAT TO REMEMBER °· Keep up with your usual exercises and follow other instructions given by your health care provider.   °· Take medicines as directed by your health care provider.   °· Keep your regular prenatal appointments.   °· Eat and drink lightly if you think you are going into labor.   °· If Braxton Hicks contractions are making you uncomfortable:   °¨ Change your position from lying down or resting to walking, or from walking to resting.   °¨ Sit and rest in a tub of warm water.   °¨ Drink 2-3 glasses of water. Dehydration may cause these contractions.   °¨ Do slow and deep breathing several times an hour.   °WHEN SHOULD I SEEK IMMEDIATE MEDICAL CARE? °Seek immediate medical care if: °· Your contractions become stronger, more regular, and closer together.   °· You have fluid leaking or gushing from your vagina.   °· You have a fever.   °· You pass blood-tinged mucus.   °· You have vaginal bleeding.   °· You have continuous abdominal pain.   °· You have low back pain that you never had before.   °· You feel your baby's head pushing down and causing pelvic pressure.   °· Your baby is not moving as much as it used to.   °Document Released: 02/06/2005 Document Revised: 02/11/2013 Document Reviewed: 11/18/2012 °[employer]® Patient Information ©2015 [employer]. This information is not intended to replace advice given to you by your health care   provider. Make sure you discuss any questions you have with your health care provider. ° °

## 2013-11-26 NOTE — MAU Note (Signed)
Patient states she is having contractions every 5 minutes. Denies bleeding or leaking and reports good fetal movement.  

## 2013-11-28 ENCOUNTER — Encounter (HOSPITAL_COMMUNITY): Payer: BC Managed Care – PPO | Admitting: Anesthesiology

## 2013-11-28 ENCOUNTER — Inpatient Hospital Stay (HOSPITAL_COMMUNITY)
Admission: AD | Admit: 2013-11-28 | Discharge: 2013-11-28 | Disposition: A | Discharge status: Home / Self Care | Payer: BC Managed Care – PPO | Source: Ambulatory Visit | Attending: Obstetrics & Gynecology | Admitting: Obstetrics & Gynecology

## 2013-11-28 ENCOUNTER — Encounter (HOSPITAL_COMMUNITY): Payer: Self-pay | Admitting: *Deleted

## 2013-11-28 ENCOUNTER — Inpatient Hospital Stay (HOSPITAL_COMMUNITY): Payer: BC Managed Care – PPO | Admitting: Anesthesiology

## 2013-11-28 ENCOUNTER — Inpatient Hospital Stay (HOSPITAL_COMMUNITY)
Admission: AD | Admit: 2013-11-28 | Discharge: 2013-12-01 | DRG: 775 | Disposition: A | Discharge status: Home / Self Care | Payer: BC Managed Care – PPO | Source: Ambulatory Visit | Attending: Obstetrics & Gynecology | Admitting: Obstetrics & Gynecology

## 2013-11-28 DIAGNOSIS — D252 Subserosal leiomyoma of uterus: Secondary | ICD-10-CM | POA: Diagnosis present

## 2013-11-28 DIAGNOSIS — Z833 Family history of diabetes mellitus: Secondary | ICD-10-CM | POA: Diagnosis not present

## 2013-11-28 DIAGNOSIS — Z3A40 40 weeks gestation of pregnancy: Secondary | ICD-10-CM | POA: Diagnosis present

## 2013-11-28 DIAGNOSIS — O471 False labor at or after 37 completed weeks of gestation: Secondary | ICD-10-CM | POA: Diagnosis present

## 2013-11-28 DIAGNOSIS — O3413 Maternal care for benign tumor of corpus uteri, third trimester: Secondary | ICD-10-CM | POA: Diagnosis present

## 2013-11-28 DIAGNOSIS — O0933 Supervision of pregnancy with insufficient antenatal care, third trimester: Secondary | ICD-10-CM | POA: Insufficient documentation

## 2013-11-28 DIAGNOSIS — Z8249 Family history of ischemic heart disease and other diseases of the circulatory system: Secondary | ICD-10-CM

## 2013-11-28 HISTORY — DX: Benign neoplasm of connective and other soft tissue, unspecified: D21.9

## 2013-11-28 LAB — CBC
HCT: 41.3 % (ref 36.0–46.0)
Hemoglobin: 14.1 g/dL (ref 12.0–15.0)
MCH: 30.9 pg (ref 26.0–34.0)
MCHC: 34.1 g/dL (ref 30.0–36.0)
MCV: 90.4 fL (ref 78.0–100.0)
Platelets: 170 10*3/uL (ref 150–400)
RBC: 4.57 MIL/uL (ref 3.87–5.11)
RDW: 15.8 % — AB (ref 11.5–15.5)
WBC: 8.3 10*3/uL (ref 4.0–10.5)

## 2013-11-28 LAB — TYPE AND SCREEN
ABO/RH(D): A POS
Antibody Screen: NEGATIVE

## 2013-11-28 MED ORDER — CITRIC ACID-SODIUM CITRATE 334-500 MG/5ML PO SOLN: 30.0000 mL | ORAL | Status: DC | PRN

## 2013-11-28 MED ORDER — LIDOCAINE HCL (PF) 1 % IJ SOLN
INTRAMUSCULAR | Status: DC | PRN
  Administered 2013-11-28 (×4): 4 mL

## 2013-11-28 MED ORDER — OXYCODONE-ACETAMINOPHEN 5-325 MG PO TABS: 1.0000 | ORAL_TABLET | ORAL | Status: DC | PRN

## 2013-11-28 MED ORDER — OXYTOCIN BOLUS FROM INFUSION: 500.0000 mL | INTRAVENOUS | Status: DC

## 2013-11-28 MED ORDER — ONDANSETRON HCL 4 MG/2ML IJ SOLN: 4.0000 mg | Freq: Four times a day (QID) | INTRAMUSCULAR | Status: DC | PRN

## 2013-11-28 MED ORDER — DIPHENHYDRAMINE HCL 50 MG/ML IJ SOLN: 12.5000 mg | INTRAMUSCULAR | Status: DC | PRN

## 2013-11-28 MED ORDER — OXYTOCIN 40 UNITS IN LACTATED RINGERS INFUSION - SIMPLE MED
62.5000 mL/h | INTRAVENOUS | Status: DC
  Administered 2013-11-29: 62.5 mL/h via INTRAVENOUS
  Filled 2013-11-28: qty 1000

## 2013-11-28 MED ORDER — LACTATED RINGERS IV SOLN
INTRAVENOUS | Status: DC
  Administered 2013-11-28 – 2013-11-29 (×4): via INTRAVENOUS

## 2013-11-28 MED ORDER — LIDOCAINE HCL (PF) 1 % IJ SOLN
30.0000 mL | INTRAMUSCULAR | Status: DC | PRN
  Filled 2013-11-28: qty 30

## 2013-11-28 MED ORDER — FENTANYL 2.5 MCG/ML BUPIVACAINE 1/10 % EPIDURAL INFUSION (WH - ANES)
14.0000 mL/h | INTRAMUSCULAR | Status: DC | PRN
  Administered 2013-11-28 – 2013-11-29 (×4): 14 mL/h via EPIDURAL
  Filled 2013-11-28 (×3): qty 125

## 2013-11-28 MED ORDER — EPHEDRINE 5 MG/ML INJ
10.0000 mg | INTRAVENOUS | Status: DC | PRN
  Filled 2013-11-28: qty 2

## 2013-11-28 MED ORDER — NALBUPHINE HCL 10 MG/ML IJ SOLN: 5.0000 mg | INTRAMUSCULAR | Status: DC | PRN

## 2013-11-28 MED ORDER — LACTATED RINGERS IV SOLN: 500.0000 mL | Freq: Once | INTRAVENOUS | Status: DC

## 2013-11-28 MED ORDER — OXYCODONE-ACETAMINOPHEN 5-325 MG PO TABS: 2.0000 | ORAL_TABLET | ORAL | Status: DC | PRN

## 2013-11-28 MED ORDER — PHENYLEPHRINE 40 MCG/ML (10ML) SYRINGE FOR IV PUSH (FOR BLOOD PRESSURE SUPPORT)
80.0000 ug | PREFILLED_SYRINGE | INTRAVENOUS | Status: DC | PRN
  Filled 2013-11-28: qty 2
  Filled 2013-11-28: qty 10

## 2013-11-28 MED ORDER — LACTATED RINGERS IV SOLN
500.0000 mL | INTRAVENOUS | Status: DC | PRN
  Administered 2013-11-28: 500 mL via INTRAVENOUS
  Administered 2013-11-29: 1000 mL via INTRAVENOUS
  Administered 2013-11-29: 500 mL via INTRAVENOUS
  Administered 2013-11-29: 1000 mL via INTRAVENOUS

## 2013-11-28 MED ORDER — PHENYLEPHRINE 40 MCG/ML (10ML) SYRINGE FOR IV PUSH (FOR BLOOD PRESSURE SUPPORT)
80.0000 ug | PREFILLED_SYRINGE | INTRAVENOUS | Status: DC | PRN
  Filled 2013-11-28: qty 2

## 2013-11-28 MED ORDER — ACETAMINOPHEN 325 MG PO TABS: 650.0000 mg | ORAL_TABLET | ORAL | Status: DC | PRN

## 2013-11-28 NOTE — Progress Notes (Signed)
Reviewed EFM strip after receiving report from Reina Fuse, RN strip reassuring with one 15x15 accel.  [place] for transport.

## 2013-11-28 NOTE — Progress Notes (Signed)
Stephanie Park MRN: [id number]  Subjective: -Called to room by nurse. Strip reviewed.  Prolonged variable deceleration noted: FHR 145 down to 110s.  LR bolus infusing.    Objective: BP 146/81  Pulse 103  Temp(Src) 98.5 F (36.9 C) (Oral)  Resp 18  Ht 5\' 3"  (1.6 m)  Wt 206 lb (93.441 kg)  BMI 36.50 kg/m2  SpO2 100%  LMP 12/12/2012     FHT:  145 bpm, Mod Var, +Decels, +Accels UC:   Q1-76min, MVUs 147mmHg, palpates strong SVE:   Dilation: 6 Effacement (%): 80 Station: 0 Exam by:: Stephanie Park, CNM Membranes: AROM x 4 hours Pitocin: None FSE & IUPC in place  Assessment:  IUP at 40.1wks Cat II FT  Active Labor-Progressive  Plan: -Continue LR bolus -Position change -Provider remained at bedside, Cat II resolved -Nurse instructed to keep patient on right side, infant tolerates well -Continue other mgmt as ordered -Will continue to obseve  Stephanie Park LYNN,CNM, MSN 11/28/2013, 11:02 PM

## 2013-11-28 NOTE — Discharge Instructions (Signed)

## 2013-11-28 NOTE — Progress Notes (Signed)
TYISHA CRESSY MRN: [id number]  Subjective: -Patient reports comfort with epidural.    Objective: BP 129/59  Pulse 72  Temp(Src) 98.3 F (36.8 C) (Oral)  Resp 18  Ht 5\' 3"  (1.6 m)  Wt 206 lb (93.441 kg)  BMI 36.50 kg/m2  SpO2 100%  LMP 12/12/2012     FHT: 135 bpm, Mod Var, +Variable Decels, +Accels UC: Irregular    SVE:   Deferred Membranes: AROM with MSF at 1900 Pitocin: None  Assessment:  IUP at 40.1wks Cat II FT  GBS Negative MSAF Active Labor  Plan: -Discussed POC for tonight including vaginal exams and pitocin augmentation -Discussed AROM findings and addressed questions r/t NICU presence and risk of meconium aspiration -Position change  -Continue other mgmt as ordered -Will reassess in ~3hrs or prn  Ramaya Guile LYNN,CNM, MSN 11/28/2013, 8:18 PM

## 2013-11-28 NOTE — MAU Note (Signed)
Patient states she is having contractions every 2-3 minutes with mucus discharge. Denies bleeding or leaking and reports good fetal movement.

## 2013-11-28 NOTE — Anesthesia Procedure Notes (Signed)
Epidural Patient location during procedure: OB Start time: 11/28/2013 6:18 PM  Staffing Anesthesiologist: Summerlynn Glauser Performed by: anesthesiologist   Preanesthetic Checklist Completed: patient identified, site marked, surgical consent, pre-op evaluation, timeout performed, IV checked, risks and benefits discussed and monitors and equipment checked  Epidural Patient position: sitting Prep: site prepped and draped and DuraPrep Patient monitoring: continuous pulse ox and blood pressure Approach: midline Location: L3-L4 Injection technique: LOR air  Needle:  Needle type: Tuohy  Needle gauge: 17 G Needle length: 9 cm and 9 Needle insertion depth: 5.5 cm Catheter type: closed end flexible Catheter size: 19 Gauge Catheter at skin depth: 10.5 cm Test dose: negative  Assessment Events: blood not aspirated, injection not painful, no injection resistance, negative IV test and no paresthesia  Additional Notes Discussed risk of headache, infection, bleeding, nerve injury and failed or incomplete block.  Patient voices understanding and wishes to proceed.  Epidural placed easily on first attempt.  No paresthesia.  Patient tolerated procedure well with no apparent complications.  Charlton Haws, MDReason for block:procedure for pain

## 2013-11-28 NOTE — H&P (Signed)
Stephanie Park is a 28 y.o. female, G1 P0 at 40.1 weeks, laboring  Patient Active Problem List   Diagnosis Date Noted  . Obesity 11/26/2013  . Rubella non-immune status, antepartum 11/26/2013  . Late prenatal care--19 weeks 11/26/2013  . Fibroids, subserous 04/28/2013    Pregnancy Course: Patient entered care at 20.6 weeks.   EDC of 11/27/13 was established by [place].      [place] evaluations:   17.4 weeks - Dating: 27.4 by LMP, 18.2 by [place] 17.4 best GA, cervical length 3.3, posterior placenta, no previa normal fluid, fibroids x2  22.1 weeks - Anatomy: EFW 1lb 0oz - 34.5%, cervical length 3.87, FHR 148, breech, posterior placenta, cervix closed, fluid normal, female, heart anatomy not seen.  Fibroids noted x2,      26.4 weeks - FU:  AUA 27.0, EFW 2lb 3oz, - 58%, FHR 131, cervical length 3.83, breech, posterior placenta, normal fluid, heart anatomy seen  32.1 weeks - FU: BPP 8/8, FHR 139,   36.4 weeks - FU: AFI 15.3m FHR 139, vertex, posterior placenta,   Significant prenatal events:   Late to care at 19 weeks , BMI 36.6, Rubella non immune Last evaluation:   39.4 weeks   VE:3/50/-3 on 11/24/13  Reason for admission:  labor  Pt States:   Contractions Frequency: 3-4 minutes         Contraction severity: strong         Fetal activity: +FM  OB History   Grav Para Term Preterm Abortions TAB SAB Ect Mult Living   1 0 0 0 0 0 0 0 0 0      Past Medical History  Diagnosis Date  . Headache(784.0)   . Fibroid    Past Surgical History  Procedure Laterality Date  . No past surgeries     Family History: family history includes Diabetes in her maternal grandmother; Hypertension in her father; Kidney disease in her father. There is no history of Cancer or Heart disease. Social History:  reports that she has never smoked. She has never used smokeless tobacco. She reports that she does not drink alcohol or use illicit drugs.   Prenatal Transfer Tool  Maternal Diabetes: No,1hr Gtt 138 Genetic  Screening: Normal Maternal Ultrasounds/Referrals: Normal Fetal Ultrasounds or other Referrals:  None Maternal Substance Abuse:  No Significant Maternal Medications:  None Significant Maternal Lab Results: None   ROS:  See HPI above, all other systems are negative  No Known Allergies  Dilation: 3 Exam by:: V. Tijuan Dantes, CNM Blood pressure 146/89, pulse 76, temperature 98.8 F (37.1 C), temperature source Oral, resp. rate 20, last menstrual period 12/12/2012.  Maternal Exam:  Uterine Assessment: Contraction frequency is rare.  Abdomen: Gravid, non tender. Fundal height is aga.  Normal external genitalia, vulva, cervix, uterus and adnexa. No lesions noted on exam.  Pelvis adequate for delivery.  Fetal presentation: Vertex by exam  Fetal Exam:  Monitor Surveillance : Continuous Monitoring Mode: Ultrasound.  NICHD: Category 1 CTXs: Q 3-63minutes EFW   7 lbs  Physical Exam: Nursing note and vitals reviewed General: alert and cooperative She appears well nourished Psychiatric: Normal mood and affect. Her behavior is normal Head: Normocephalic Eyes: Pupils are equal, round, and reactive to light Neck: Normal range of motion Cardiovascular: RRR without murmur  Respiratory: CTAB. Effort normal  Abd: soft, non-tender, +BS, no rebound, no guarding  Genitourinary: Vagina normal  Neurological: A&Ox3 Skin: Warm and dry  Musculoskeletal: Normal range of motion  Homan's sign negative bilaterally  No evidence of DVTs.  Edema: 1+ bilaterally non-pitting edema DTR: 2+ Clonus: None   Prenatal labs: ABO, Rh: A/POS/-- (03/09 1349) Antibody: NEG (03/09 1349) Rubella:   non immune RPR: NON REAC (03/09 1349)  HBsAg: NEGATIVE (03/09 1349)  HIV: NON REACTIVE (03/09 1349)  GBS:  negative Sickle cell/Hgb electrophoresis:  WNL Pap:   GC:   negative Chlamydia: negative Genetic screenings:   Glucola:  1 hour 138  Assessment:  IUP at 40.1 weeks NICHD: Category 1 Membranes:  intact Bishop Score: 6 GBS negative  Plan:  Admit to L&D for active management of labor. Possible augmentation options reviewed including foley bulb, AROM and/or pitocin.  IV pain medication per orders PRN Epidural per patient request Foley cath after patient is comfortable with epidural Anticipate SVD  Labor mgmt as ordered  Attending MD available at all times.  Brityn Mastrogiovanni, CNM, MSN 11/28/2013, 5:14 PM       All information will be confirmed upon admisson

## 2013-11-28 NOTE — H&P (Signed)
Labor Progress  Subjective: Comfortable with an epidural  Objective: BP 126/72  Pulse 71  Temp(Src) 96.7 F (35.9 C) (Oral)  Resp 20  Ht 5\' 3"  (1.6 m)  Wt 93.441 kg (206 lb)  BMI 36.50 kg/m2  SpO2 100%  LMP 12/12/2012     FHT: 135, moderate variability, + accel, no decel CTX:  regular, every 2-3 minutes Uterus gravid, soft non tender SVE:  Dilation: 4 Effacement (%): 90 Station: -1 Exam by:: vstandard,cnm   Assessment:  IUP at 40.1 weeks NICHD: Category 1 Membranes: AROM this mec Labor progress: active GBS: negative   Plan: Continue labor plan Continuous monitoring Frequent position changes to facilitate fetal rotation and descent.     Stephanie Park, CNM, MSN 11/28/2013. 7:12 PM

## 2013-11-28 NOTE — MAU Provider Note (Signed)
MAU Addendum Note  No results found for this or any previous visit (from the past 24 hour(s)).  Cervical exam unchanges DC to home labor precaution and kick counts FU in the office at next [facility] visit   [place], CNM, MSN 11/28/2013. 2:32 PM

## 2013-11-28 NOTE — Progress Notes (Signed)
SARAYA TIREY MRN: [id number]  Subjective: -Strip Reviewed.  Prolonged deceleration noted. Nurse reports from mother position change.  To room to assess and provide interventions.   Objective: BP 133/76  Pulse 101  Temp(Src) 98.3 F (36.8 C) (Oral)  Resp 18  Ht 5\' 3"  (1.6 m)  Wt 206 lb (93.441 kg)  BMI 36.50 kg/m2  SpO2 100%  LMP 12/12/2012     FHT: 145 bpm, Mod Var, + Prolonged Decels, +Accels UC:  Q1-30min, palpates moderate  SVE:   Dilation: 4 Effacement (%): 90 Station: 0 Bloody Show Exam by:: Milinda Cave, CNM Membranes:AROM at 1900 Pitocin:None IUPC & FSE inserted  Assessment:  IUP at 40.1wks Cat II FT  Active Labor  Plan: -Discussed and inserted FSE & IUPC  -Position change -Will monitor for 2 hours and decide on necessity of augmentation -Continue other mgmt as ordered  Khaya Theissen LYNN,CNM, MSN 11/28/2013, 9:15 PM

## 2013-11-28 NOTE — Anesthesia Preprocedure Evaluation (Addendum)
Anesthesia Evaluation  Patient identified by MRN, date of birth, ID band Patient awake    Reviewed: Allergy & Precautions, H&P , NPO status , Patient's Chart, lab work & pertinent test results, reviewed documented beta blocker date and time   History of Anesthesia Complications Negative for: history of anesthetic complications  Airway Mallampati: II TM Distance: >3 FB Neck ROM: full    Dental  (+) Teeth Intact   Pulmonary neg pulmonary ROS,  breath sounds clear to auscultation        Cardiovascular hypertension, Rhythm:regular Rate:Normal     Neuro/Psych  Headaches, negative psych ROS   GI/Hepatic negative GI ROS, Neg liver ROS,   Endo/Other  BMI 36.6  Renal/GU negative Renal ROS  Female GU complaint     Musculoskeletal   Abdominal   Peds  Hematology negative hematology ROS (+)   Anesthesia Other Findings   Reproductive/Obstetrics (+) Pregnancy                           Anesthesia Physical Anesthesia Plan  ASA: III  Anesthesia Plan: Epidural   Post-op Pain Management:    Induction:   Airway Management Planned:   Additional Equipment:   Intra-op Plan:   Post-operative Plan:   Informed Consent: I have reviewed the patients History and Physical, chart, labs and discussed the procedure including the risks, benefits and alternatives for the proposed anesthesia with the patient or authorized representative who has indicated his/her understanding and acceptance.     Plan Discussed with:   Anesthesia Plan Comments:        Anesthesia Quick Evaluation

## 2013-11-28 NOTE — MAU Provider Note (Signed)
Stephanie Park is a 28 y.o. G1P0 at 40.1 weeks c/o painful ctx   History     Patient Active Problem List   Diagnosis Date Noted  . Obesity 11/26/2013  . Rubella non-immune status, antepartum 11/26/2013  . Late prenatal care--19 weeks 11/26/2013  . Fibroids, subserous 04/28/2013    Chief Complaint  Patient presents with  . Labor Eval   HPI  OB History   Grav Para Term Preterm Abortions TAB SAB Ect Mult Living   1 0 0 0 0 0 0 0 0 0       Past Medical History  Diagnosis Date  . [id number](423.9)     Past Surgical History  Procedure Laterality Date  . No past surgeries      Family History  Problem Relation Age of Onset  . Kidney disease Father   . Hypertension Father   . Cancer Neg Hx   . Diabetes Maternal Grandmother   . Heart disease Neg Hx     History  Substance Use Topics  . Smoking status: Never Smoker   . Smokeless tobacco: Never Used  . Alcohol Use: No    Allergies: No Known Allergies  Prescriptions prior to admission  Medication Sig Dispense Refill  . MAGNESIUM PO Take 1 tablet by mouth daily.      . Prenatal Vit-Fe Fumarate-FA (PRENATAL MULTIVITAMIN) TABS tablet Take 1 tablet by mouth daily at 12 noon.        ROS See HPI above, all other systems are negative  Physical Exam   Temperature 98.7 F (37.1 C), temperature source Oral, resp. rate 18, height 5\' 3"  (1.6 m), weight 93.804 kg (206 lb 12.8 oz), last menstrual period 12/12/2012.  Physical Exam Ext:  WNL ABD: Soft, non tender to palpation, no rebound or guarding SVE: 2-3/60/-2   ED Course  Assessment: IUP at  40.0 weeks Membranes: intact FHR: Category 1 CTX: 4-6 minutes   Plan: Recheck in 1-2 hours Consult with Dr. Marena Chancy [place], CNM, MSN 11/28/2013. 12:15 PM

## 2013-11-29 ENCOUNTER — Encounter (HOSPITAL_COMMUNITY): Payer: Self-pay | Admitting: Obstetrics & Gynecology

## 2013-11-29 LAB — CBC
HCT: 39 % (ref 36.0–46.0)
HEMOGLOBIN: 13.7 g/dL (ref 12.0–15.0)
MCH: 31.2 pg (ref 26.0–34.0)
MCHC: 35.1 g/dL (ref 30.0–36.0)
MCV: 88.8 fL (ref 78.0–100.0)
Platelets: 146 10*3/uL — ABNORMAL LOW (ref 150–400)
RBC: 4.39 MIL/uL (ref 3.87–5.11)
RDW: 15.8 % — AB (ref 11.5–15.5)
WBC: 12.3 10*3/uL — ABNORMAL HIGH (ref 4.0–10.5)

## 2013-11-29 LAB — HIV ANTIBODY (ROUTINE TESTING W REFLEX): HIV 1&2 Ab, 4th Generation: NONREACTIVE

## 2013-11-29 LAB — RPR

## 2013-11-29 LAB — ABO/RH: ABO/RH(D): A POS

## 2013-11-29 MED ORDER — OXYCODONE-ACETAMINOPHEN 5-325 MG PO TABS: 1.0000 | ORAL_TABLET | ORAL | Status: DC | PRN

## 2013-11-29 MED ORDER — IBUPROFEN 600 MG PO TABS
600.0000 mg | ORAL_TABLET | Freq: Four times a day (QID) | ORAL | Status: DC
  Administered 2013-11-29 – 2013-12-01 (×8): 600 mg via ORAL
  Filled 2013-11-29 (×8): qty 1

## 2013-11-29 MED ORDER — BENZOCAINE-MENTHOL 20-0.5 % EX AERO: 1.0000 "application " | INHALATION_SPRAY | CUTANEOUS | Status: DC | PRN

## 2013-11-29 MED ORDER — LANOLIN HYDROUS EX OINT: TOPICAL_OINTMENT | CUTANEOUS | Status: DC | PRN

## 2013-11-29 MED ORDER — OXYCODONE-ACETAMINOPHEN 5-325 MG PO TABS: 2.0000 | ORAL_TABLET | ORAL | Status: DC | PRN

## 2013-11-29 MED ORDER — WITCH HAZEL-GLYCERIN EX PADS: 1.0000 "application " | MEDICATED_PAD | CUTANEOUS | Status: DC | PRN

## 2013-11-29 MED ORDER — SENNOSIDES-DOCUSATE SODIUM 8.6-50 MG PO TABS
2.0000 | ORAL_TABLET | ORAL | Status: DC
  Administered 2013-11-30 – 2013-12-01 (×2): 2 via ORAL
  Filled 2013-11-29 (×2): qty 2

## 2013-11-29 MED ORDER — ZOLPIDEM TARTRATE 5 MG PO TABS: 5.0000 mg | ORAL_TABLET | Freq: Every evening | ORAL | Status: DC | PRN

## 2013-11-29 MED ORDER — DIBUCAINE 1 % RE OINT: 1.0000 "application " | TOPICAL_OINTMENT | RECTAL | Status: DC | PRN

## 2013-11-29 MED ORDER — DIPHENHYDRAMINE HCL 25 MG PO CAPS: 25.0000 mg | ORAL_CAPSULE | Freq: Four times a day (QID) | ORAL | Status: DC | PRN

## 2013-11-29 MED ORDER — PRENATAL MULTIVITAMIN CH
1.0000 | ORAL_TABLET | Freq: Every day | ORAL | Status: DC
  Administered 2013-11-29 – 2013-11-30 (×2): 1 via ORAL
  Filled 2013-11-29 (×2): qty 1

## 2013-11-29 MED ORDER — SIMETHICONE 80 MG PO CHEW: 80.0000 mg | CHEWABLE_TABLET | ORAL | Status: DC | PRN

## 2013-11-29 MED ORDER — ONDANSETRON HCL 4 MG/2ML IJ SOLN: 4.0000 mg | INTRAMUSCULAR | Status: DC | PRN

## 2013-11-29 MED ORDER — ONDANSETRON HCL 4 MG PO TABS: 4.0000 mg | ORAL_TABLET | ORAL | Status: DC | PRN

## 2013-11-29 MED ORDER — MEASLES, MUMPS & RUBELLA VAC ~~LOC~~ INJ
0.5000 mL | INJECTION | Freq: Once | SUBCUTANEOUS | Status: DC
  Filled 2013-11-29: qty 0.5

## 2013-11-29 NOTE — Anesthesia Postprocedure Evaluation (Signed)
Anesthesia Post Note  Patient: Stephanie Park  Procedure(s) Performed: * No procedures listed *  Anesthesia type: Epidural  Patient location: Mother/Baby  Post pain: Pain level controlled  Post assessment: Post-op Vital signs reviewed  Last Vitals:  Filed Vitals:   11/29/13 1630  BP: 146/65  Pulse: 70  Temp: 36.8 C  Resp: 20    Post vital signs: Reviewed  Level of consciousness:alert  Complications: No apparent anesthesia complications

## 2013-11-29 NOTE — Progress Notes (Signed)
Stephanie Park MRN: [id number]  Subjective: -Strip Reviewed. Prolonged Deceleration noted.  Patient on right side.   Objective: BP 124/107  Pulse 102  Temp(Src) 99.4 F (37.4 C) (Axillary)  Resp 18  Ht 5\' 3"  (1.6 m)  Wt 206 lb (93.441 kg)  BMI 36.50 kg/m2  SpO2 100%  LMP 12/12/2012     FHT: 135 bpm, Mod Var, -Decels, +Accels UC:   Q minute, palpates moderate to strong SVE:   Dilation: 9 Effacement (%): 90 Station: +1 Exam by:: Milinda Cave, CNM Membranes: AROM x 10hrs Pitocin: None  Assessment:  IUP at 40.2wks Cat II FT Active Labor-Progressive  Plan: -Position change from right sims back to hands and knees -Resolution of Cat II FHT noted -Continue other mgmt as ordered -Room prepped for delivery -Will continue to observe  [facility], Stephanie Park,CNM, MSN 11/29/2013, 6:08 AM

## 2013-11-29 NOTE — Progress Notes (Signed)
  Subjective: Feeling pressure.  Objective: BP 121/63  Pulse 86  Temp(Src) 98.6 F (37 C) (Oral)  Resp 18  Ht 5\' 3"  (1.6 m)  Wt 206 lb (93.441 kg)  BMI 36.50 kg/m2  SpO2 100%  LMP 12/12/2012 I/O last 3 completed shifts: In: -  Out: 850 [Urine:850]    FHT: Category 2--episodes of bradycardia with pushing, moderate variability, recovers after UC.  O2 on, tilted to side. UC:   regular, every 80minutes SVE:   Complete, vtx +2, MSF noted   Assessment:  2nd stage labor Episodes of bradycardia  Plan: Dr. Alesia Richards notified--will come for delivery, VE prn.  Donnel Saxon CNM 11/29/2013, 9:48 AM

## 2013-11-29 NOTE — Progress Notes (Signed)
  Subjective: Feeling some pain in left lower abdomen.  Objective: BP 121/63  Pulse 86  Temp(Src) 98.6 F (37 C) (Oral)  Resp 18  Ht 5\' 3"  (1.6 m)  Wt 206 lb (93.441 kg)  BMI 36.50 kg/m2  SpO2 100%  LMP 12/12/2012 I/O last 3 completed shifts: In: -  Out: 850 [Urine:850]    FHT: Category 2--decel with episode of tachysystole, with moderate variability throughout.  Resolved with diminished contraction frequency. UC:   irregular, every 2-5 minutes SVE:   Anterior rim, vtx, +2 with caput  Assessment:  Progressive labor MSF  Plan: Continue to observe at present--position to facilitate completion of dilation.  Donnel Saxon CNM 11/29/2013, 0730

## 2013-11-29 NOTE — Lactation Note (Signed)
This note was copied from the chart of [place]. Lactation Consultation Note     Initial consult with this mom of a term baby, now 2 hours old, and in the [place],  after needing some resuscitation in the DR, suctioned for thick meconium, PPV, and being malodorous. i started mom pumping with DEP, showed her how to hand pump, an set premie setting. Mom was able to express about 0.5 mlsof thick colostrum. I reviewed the NICU booklet on providing EBM, and some otf the Baby and Me book, with mom. Mom was very sleepy, and will need teaching to be reinforced. Mom knows to call for questions/concerns,.  Information faxedto WIC.  Patient Name: Stephanie Park [id number] Date: 11/29/2013 Reason for consult: Initial assessment;NICU baby   Maternal Data Formula Feeding for Exclusion: Yes (baby in NICU) Has patient been taught Hand Expression?: Yes Does the patient have breastfeeding experience prior to this delivery?: No  Feeding    LATCH Score/Interventions                      Lactation Tools Discussed/Used WIC Program: Yes (info faxed to wic for dep) Pump Review: Setup, frequency, and cleaning;Milk Storage;Other (comment) (premie setting, hand expression) Initiated by:: clee rn a 2 hours post partum Date initiated:: 11/29/13   Consult Status Consult Status: Follow-up Date: 11/30/13 Follow-up type: In-patient    Tonna Corner 11/29/2013, 1:00 PM

## 2013-11-29 NOTE — Progress Notes (Signed)
QUETZALLY CALLAS MRN: [id number]  Subjective: -Strip reviewed with Dr. Octavio Manns.  In to discuss with patient.  Patient reports no change in pain.    Objective: BP 128/61  Pulse 60  Temp(Src) 100.3 F (37.9 C) (Axillary)  Resp 18  Ht 5\' 3"  (1.6 m)  Wt 206 lb (93.441 kg)  BMI 36.50 kg/m2  SpO2 100%  LMP 12/12/2012     FHT: 150 bpm, Mod Var, -Decels, +Accels UC:   Q1-32min, palpates strong, MVUs 277mmHg SVE:   Dilation: 7 Effacement (%): 80 Station: +1 Exam by:: e. poore, rn Membranes:AROM x 8hrs Pitocin:None O2at 10L  Assessment:  IUP at 40.2wks Cat I FT  Active Labor-Progressive  Plan: -Discussed EFM findings with patient and FOB.  Informed that we will continue to observe, at this time, as baby is responsive to interventions. -Continue position change -Continue other mgmt as ordered  Zurri Rudden LYNN,CNM, MSN 11/29/2013, 3:01 AM

## 2013-11-29 NOTE — Progress Notes (Signed)
Stephanie Park MRN: [id number]  Subjective: -Patient denies pain.  FOB at bedside.  Objective: BP 122/64  Pulse 73  Temp(Src) 99.3 F (37.4 C) (Axillary)  Resp 18  Ht 5\' 3"  (1.6 m)  Wt 206 lb (93.441 kg)  BMI 36.50 kg/m2  SpO2 100%  LMP 12/12/2012     FHT: 135 bpm, Mod Var, + Prolonged Decels, +Accels UC:   Q1-36min, MVUs 24mmHg, palpates moderate to strong SVE:   Dilation: 8 Effacement (%): 90 Station: +1 Exam by:: Milinda Cave, CNM Membranes: AROM x 9hrs Pitocin:None FSE & IUPC in place   Assessment:  IUP at 40.2wks Cat II FT  Active Labor-Progressive Adequate MVUs  Plan: -Position to hands and knees -Resolution of Cat II to Cat I  -Continue other mgmt as ordered -Will continue to observe  Stephanie Park,CNM, MSN 11/29/2013, 4:27 AM

## 2013-11-29 NOTE — Plan of Care (Signed)
Problem: Phase II Progression Outcomes Goal: Initial bonding PT touched baby through  [facility] before taken to NICU. Goal: Skin to skin contact initiated Outcome: Not Met (add Reason) Taken to NICU for oxygen monitoring-Fob accompanying baby. Goal: Initiate breastfeeding within 1hr delivery Outcome: Not Met (add Reason) Baby taken to NICU.

## 2013-11-30 LAB — CBC
HCT: 33.3 % — ABNORMAL LOW (ref 36.0–46.0)
HEMOGLOBIN: 11 g/dL — AB (ref 12.0–15.0)
MCH: 30 pg (ref 26.0–34.0)
MCHC: 33 g/dL (ref 30.0–36.0)
MCV: 90.7 fL (ref 78.0–100.0)
Platelets: 142 10*3/uL — ABNORMAL LOW (ref 150–400)
RBC: 3.67 MIL/uL — AB (ref 3.87–5.11)
RDW: 16.1 % — ABNORMAL HIGH (ref 11.5–15.5)
WBC: 13.5 10*3/uL — AB (ref 4.0–10.5)

## 2013-11-30 NOTE — Progress Notes (Signed)
CSW attempted to meet with pt however she was visiting with infant in the NICU.  CSW follow up later this afternoon.

## 2013-11-30 NOTE — Progress Notes (Signed)
Stephanie Park   Subjective: Post Partum Day 1 Vaginal delivery, left labial laceration Patient up ad lib, denies syncope or dizziness. Reports consuming regular diet without issues and denies N/V No issues with urination and reports bleeding is appropriate  Feeding:  breastfeeding Contraceptive plan:   unsure  Objective: Temp:  [98 F (36.7 C)-99.8 F (37.7 C)] 98 F (36.7 C) (10/11 0610) Pulse Rate:  [70-84] 83 (10/11 0610) Resp:  [18-20] 18 (10/11 0610) BP: (118-146)/(62-76) 131/62 mmHg (10/11 0610) SpO2:  [99 %-100 %] 100 % (10/11 0610)  Physical Exam:  General: alert and cooperative Ext: WNL, +2 edema. No evidence of DVT seen on physical exam. Breast: Soft filling Lungs: CTAB Heart RRR without murmur Abdomen:  Soft, fundus firm, lochia scant, + bowel sounds, non distended, non tender Lochia: appropriate Uterine Fundus: firm Laceration: healing well    Recent Labs  11/29/13 1030 11/30/13 0535  HGB 13.7 11.0*  HCT 39.0 33.3*    Assessment S/P Vaginal Delivery-Day 1 Stable  Normal Involution Breastfeeding  Plan: Continue current care Plan for discharge tomorrow, Breastfeeding and Lactation consult Lactation support   Ravina Milner, CNM, MSN 11/30/2013, 10:54 AM

## 2013-11-30 NOTE — Clinical Social Work Maternal (Signed)
Clinical Social Work Department PSYCHOSOCIAL ASSESSMENT - MATERNAL/CHILD 11/30/2013  Patient:  LENZI, MARMO  Account Number:  [id number]  Admit Date:  11/28/2013  Ardine Eng Name:   Bennie Pierini    Clinical Social Worker:  Gerri Spore, LCSW   Date/Time:  11/30/2013 01:32 PM  Date Referred:  11/30/2013   Referral source  NICU     Referred reason  NICU   Other referral source:    I:  FAMILY / [place] legal guardian:  PARENT  Guardian - Name Guardian - Age Guardian - Address  Juhi Lagrange [address]; [place], [place] [address]  Samuel Mcpherson 33 Same as above   Other household support members/support persons Other support:   Family    II  PSYCHOSOCIAL DATA Information Source:  Patient Interview  Occupational hygienist Employment:   Museum/gallery curator resources:  Multimedia programmer If [place]:    School / Grade:   Maternity Care Coordinator / Child Services Coordination / Early Interventions:  Cultural issues impacting care:    III  STRENGTHS Strengths  Adequate Resources  Home prepared for Child (including basic supplies)  Supportive family/friends   Strength comment:    IV  RISK FACTORS AND CURRENT PROBLEMS Current Problem:  None   Risk Factor & Current Problem Patient Issue Family Issue Risk Factor / Current Problem Comment   N N     V  SOCIAL WORK ASSESSMENT CSW met with pt to offer resources & support as needed.  Pt was very pleasant during assessment, as she smiled & easily engaged in conversation.  She is hopeful that "Humphrey" will discharge home with her tomorrow or one day next week. She denies any history of depression or depressed moods at this time.  Pt has reliable transportation to come visit with the infant during hospitalization.  Pt would like to speak with Neo or NP to discuss the infant's condition & estimated discharge date.  CSW spoke with NP, Karena Addison who agrees to update MOB today.   Pt is not  employed.  She is a '15 graduate of [employer], Social Work program.  She identifies her spouse as her primary support person.  She has the support of other family members as well.  Pt has all the necessary supplies for the infant & appears to be doing well emotionally.  CSW discussed PP depression signs/symptoms & encouraged her to seek medical attention if needed.  No other social concerns noted at this time. CSW will continue to follow & assist as needed until discharge.  Pt thanked CSW for consult.      VI SOCIAL WORK PLAN Social Work Plan  Psychosocial Support/Ongoing Assessment of Needs   Type of pt/family education:   If child protective services report - county:   If child protective services report - date:   Information/referral to community resources comment:   Other social work plan:

## 2013-12-01 ENCOUNTER — Ambulatory Visit: Payer: Self-pay

## 2013-12-01 MED ORDER — HYDROCHLOROTHIAZIDE 25 MG PO TABS
25.0000 mg | ORAL_TABLET | Freq: Every day | ORAL | Status: DC
  Administered 2013-12-01: 25 mg via ORAL
  Filled 2013-12-01: qty 1

## 2013-12-01 MED ORDER — IBUPROFEN 600 MG PO TABS: 600.0000 mg | ORAL_TABLET | Freq: Four times a day (QID) | ORAL | Status: DC | PRN

## 2013-12-01 MED ORDER — HYDROCHLOROTHIAZIDE 25 MG PO TABS: 25.0000 mg | ORAL_TABLET | Freq: Every day | ORAL | Status: DC

## 2013-12-01 NOTE — Lactation Note (Addendum)
This note was copied from the chart of [place]. Lactation Consultation Note    Follow up[ consult with this FOB , at baby's bedside, and with baby's nurse, Blanchie Dessert. Dad aware baby is on 9-12 feeding schedule, and to let mom know that we can try breast feeding today, that I will be available until 4:30 pm today, or 10:30-7:00 pm tomorrow. , or mom can work with her baby's nurse  at other times.   Patient Name: Boy Caden Fatica [id number] Date: 12/01/2013 Reason for consult: Follow-up assessment   Maternal Data    Feeding Feeding Type: Formula  LATCH Score/Interventions                      Lactation Tools Discussed/Used Pump Review: Setup, frequency, and cleaning   Consult Status Consult Status: PRN Follow-up type: In-patient (NICU)    Tonna Corner 12/01/2013, 11:28 AM

## 2013-12-01 NOTE — Lactation Note (Signed)
This note was copied from the chart of [place]. Lactation Consultation Note    Follow up consult with this mom of a term NICU baby, now 37 hours old. Mom has not been compliant with pumping every 3 hours, so I reviewed with her the importance of pumping at least 8 times a day, and the importamce of the first two weeks. I reviewed hand expression with mom, and she will still need help with this. I encouraged ehr to add this to pumping every 3 hours. Mom has private insurance and [place], but thinks her DEP should have arrived at her home by now. She will take home a hand pump, which I reviewed with her how to use, and will rent a DEP if needed, later today or tomorrow. I am going to try and see if the baby can latch today for the first time - he has been doing some bottle feeding/gavage, but is on room air now.   Patient Name: Stephanie Park [id number] Date: 12/01/2013 Reason for consult: Follow-up assessment;NICU baby   Maternal Data    Feeding Feeding Type: Formula  LATCH Score/Interventions                      Lactation Tools Discussed/Used Pump Review: Setup, frequency, and cleaning   Consult Status Consult Status: PRN Follow-up type: In-patient (NICU)    Tonna Corner 12/01/2013, 9:32 AM

## 2013-12-01 NOTE — Progress Notes (Signed)
Pt discharged to home with husband.  Condition stable.  Pt ambulated to NICU with husband with plans to leave hospital from there.  No equipment for home ordered at discharge.

## 2013-12-01 NOTE — Lactation Note (Signed)
This note was copied from the chart of [place]. Lactation Consultation Note Follow up consult with this mom and term baby, now 4 hours old. i assisted mom with latching baby fistr the first time. He was cuing, and latched on and off for about 30 minutes. I fed him drops of EBm/colostrum, which stimulated his latch and suckles. He was gavaged 12 mls of formula, about 15 minutes into the breast feed, by gravity, and he tolerated this well. I left mom skin to skin with baby, both seeming very content. Dad present and very involved.   Patient Name: Stephanie Park [id number] Date: 12/01/2013 Reason for consult: Follow-up assessment;NICU baby   Maternal Data    Feeding Feeding Type: Breast Fed Length of feed: 30 min  LATCH Score/Interventions Latch: Repeated attempts needed to sustain latch, nipple held in mouth throughout feeding, stimulation needed to elicit sucking reflex. Intervention(s): Adjust position;Assist with latch;Breast compression  Audible Swallowing: None  Type of Nipple: Everted at rest and after stimulation  Comfort (Breast/Nipple): Soft / non-tender     Hold (Positioning): Assistance needed to correctly position infant at breast and maintain latch. Intervention(s): Breastfeeding basics reviewed;Support Pillows;Position options;Skin to skin  LATCH Score: 6  Lactation Tools Discussed/Used Pump Review: Setup, frequency, and cleaning   Consult Status Consult Status: Follow-up Follow-up type: In-patient (NICU)    Tonna Corner 12/01/2013, 12:40 PM

## 2013-12-01 NOTE — Discharge Summary (Signed)
Vaginal Delivery Discharge Summary  Stephanie Park  DOB:    November 20, 1985 MRN:    [id number] CSN:    [id number]  Date of admission:                  11/28/13  Date of discharge:                   12/01/13  Procedures this admission:   SVB, repair of left labia minora laceration  Date of Delivery: 11/29/13  Newborn Data:  Live born female  Birth Weight: 7 lb 8.3 oz (3410 g) APGAR: 3, 7  Baby remains in NICU at the time of mother's d/c, but baby d/c is anticipated soon Name: [facility] Circumcision Plan: Outpatient  History of Present Illness:  Stephanie Park is a 28 y.o. female, G1P1001, who presents at [redacted]w[redacted]d weeks gestation. The patient has been followed at the [facility] and Gynecology division of [facility]. She was admitted onset of labor. Her pregnancy has been complicated by:  Patient Active Problem List   Diagnosis Date Noted  . Vaginal delivery 12/01/2013  . Obesity 11/26/2013  . Rubella non-immune status, antepartum 11/26/2013  . Late prenatal care--19 weeks 11/26/2013  . Fibroids, subserous 04/28/2013     Hospital Course:  Admitted 11/28/13 in early labor. Negative GBS. Progressed without pitocin augmentation. Utilized epidural for pain management.  Sporadic FHR decels were noted during labor, moderate variability maintained, MSF noted.  Delivery was performed by Dr. Alesia Richards due to doctor's desire, with newborn noted to be floppy with poor respiratory effort at delivery.  Cord pH was WNL, and 5 min Apgar improved to 7 with stimuation and suctioning.  Baby still had difficulty maintaining appropriate O2 stats and was transported to NICU. Patient tolerated the procedure without difficulty, with left labia minora laceration noted. Mother had an uncomplicated postpartum course, with pumping for breast feeding going well. Mom's physical exam was WNL, and she was discharged home in stable condition. Contraception plan was undecided at the time  of d/c.  Baby was improving quickly and was anticipated to be d/c'd in the upcoming week.  Patient planned outpatient circumcision after d/c from NICU.  Patient received adequate benefit from po pain medications, using Motrin.  She did have significant pedal edema and was sent home on HCTZ 25 mg x 7 days.   Feeding:  breast  Contraception:  Undecided at present.  Discharge hemoglobin:  Hemoglobin  Date Value Ref Range Status  11/30/2013 11.0* 12.0 - 15.0 g/dL Final     DELTA CHECK NOTED     REPEATED TO VERIFY     HCT  Date Value Ref Range Status  11/30/2013 33.3* 36.0 - 46.0 % Final    Discharge Physical Exam:   General: alert Lochia: appropriate Uterine Fundus: firm Incision: healing well DVT Evaluation: No evidence of DVT seen on physical exam. Negative Homan's sign. Calf/Ankle edema is present, 2+. DTR 1-2 +, no clonus.  Intrapartum Procedures: spontaneous vaginal delivery Postpartum Procedures: none Complications-Operative and Postpartum: 1st degree left labial laceration  Discharge Diagnoses: Term Pregnancy-delivered and MSF  Discharge Information:  Activity:           pelvic rest Diet:                routine Medications: Ibuprofen and HCTZ 25 mg daily x 7 days. Condition:      stable Instructions:     Discharge to: home  Follow-up Information  Follow up with [facility]. Schedule an appointment as soon as possible for a visit in 6 weeks. (Call for appt at [facility] in 6 weeks.  After baby's discharge from NICU, call office to scheduled circumcision.)    Specialty:  Obstetrics and Gynecology   Contact information:   [address] [place] [id number] [phone]       Donnel Saxon CNM 12/01/2013 10:13 AM

## 2013-12-01 NOTE — Discharge Instructions (Signed)
Peripheral Edema You have swelling in your legs (peripheral edema). This swelling is due to excess accumulation of salt and water in your body. Edema may be a sign of heart, kidney or liver disease, or a side effect of a medication. It may also be due to problems in the leg veins. Elevating your legs and using special support stockings may be very helpful, if the cause of the swelling is due to poor venous circulation. Avoid long periods of standing, whatever the cause. Treatment of edema depends on identifying the cause. Chips, pretzels, pickles and other salty foods should be avoided. Restricting salt in your diet is almost always needed. Water pills (diuretics) are often used to remove the excess salt and water from your body via urine. These medicines prevent the kidney from reabsorbing sodium. This increases urine flow. Diuretic treatment may also result in lowering of potassium levels in your body. Potassium supplements may be needed if you have to use diuretics daily. Daily weights can help you keep track of your progress in clearing your edema. You should call your caregiver for follow up care as recommended. SEEK IMMEDIATE MEDICAL CARE IF:   You have increased swelling, pain, redness, or heat in your legs.  You develop shortness of breath, especially when lying down.  You develop chest or abdominal pain, weakness, or fainting.  You have a fever.  You have severe headaches, changes in your vision, or upper abdominal pain. Document Released: 03/16/2004 Document Revised: 05/01/2011 Document Reviewed: 02/24/2009 [facility] Patient Information 2015 [place], [place]. This information is not intended to replace advice given to you by your health care provider. Make sure you discuss any questions you have with your health care provider.   Postpartum Care After Vaginal Delivery After you deliver your newborn (postpartum period), the usual stay in the hospital is 24-72 hours. If there were problems  with your labor or delivery, or if you have other medical problems, you might be in the hospital longer.  While you are in the hospital, you will receive help and instructions on how to care for yourself and your newborn during the postpartum period.  While you are in the hospital:  Be sure to tell your nurses if you have pain or discomfort, as well as where you feel the pain and what makes the pain worse.  If you had an incision made near your vagina (episiotomy) or if you had some tearing during delivery, the nurses may put ice packs on your episiotomy or tear. The ice packs may help to reduce the pain and swelling.  If you are breastfeeding, you may feel uncomfortable contractions of your uterus for a couple of weeks. This is normal. The contractions help your uterus get back to normal size.  It is normal to have some bleeding after delivery.  For the first 1-3 days after delivery, the flow is red and the amount may be similar to a period.  It is common for the flow to start and stop.  In the first few days, you may pass some small clots. Let your nurses know if you begin to pass large clots or your flow increases.  Do not  flush blood clots down the toilet before having the nurse look at them.  During the next 3-10 days after delivery, your flow should become more watery and pink or brown-tinged in color.  Ten to fourteen days after delivery, your flow should be a small amount of yellowish-white discharge.  The amount of your flow  will decrease over the first few weeks after delivery. Your flow may stop in 6-8 weeks. Most women have had their flow stop by 12 weeks after delivery.  You should change your sanitary pads frequently.  Wash your hands thoroughly with soap and water for at least 20 seconds after changing pads, using the toilet, or before holding or feeding your newborn.  You should feel like you need to empty your bladder within the first 6-8 hours after delivery.  In  case you become weak, lightheaded, or faint, call your nurse before you get out of bed for the first time and before you take a shower for the first time.  Within the first few days after delivery, your breasts may begin to feel tender and full. This is called engorgement. Breast tenderness usually goes away within 48-72 hours after engorgement occurs. You may also notice milk leaking from your breasts. If you are not breastfeeding, do not stimulate your breasts. Breast stimulation can make your breasts produce more milk.  Spending as much time as possible with your newborn is very important. During this time, you and your newborn can feel close and get to know each other. Having your newborn stay in your room (rooming in) will help to strengthen the bond with your newborn. It will give you time to get to know your newborn and become comfortable caring for your newborn.  Your hormones change after delivery. Sometimes the hormone changes can temporarily cause you to feel sad or tearful. These feelings should not last more than a few days. If these feelings last longer than that, you should talk to your caregiver.  If desired, talk to your caregiver about methods of family planning or contraception.  Talk to your caregiver about immunizations. Your caregiver may want you to have the following immunizations before leaving the hospital:  Tetanus, diphtheria, and pertussis (Tdap) or tetanus and diphtheria (Td) immunization. It is very important that you and your family (including grandparents) or others caring for your newborn are up-to-date with the Tdap or Td immunizations. The Tdap or Td immunization can help protect your newborn from getting ill.  Rubella immunization.  Varicella (chickenpox) immunization.  Influenza immunization. You should receive this annual immunization if you did not receive the immunization during your pregnancy. Document Released: 12/04/2006 Document Revised: 11/01/2011  Document Reviewed: 10/04/2011 [facility] Patient Information 2015 [place], [place]. This information is not intended to replace advice given to you by your health care provider. Make sure you discuss any questions you have with your health care provider.

## 2013-12-01 NOTE — Progress Notes (Signed)
Patient Rubella= non immune.  Given information about MMR vaccination.  Patient informed this nurse refusal to the vaccine.

## 2013-12-02 ENCOUNTER — Ambulatory Visit: Payer: Self-pay

## 2013-12-02 NOTE — Lactation Note (Signed)
This note was copied from the chart of [place]. Lactation Consultation Note    Follow up consult with this mom of a term NICU baby, now 35 hours post partum. Mom has not been pumping - she is "very tired" I reviewed with her the importance of pumping. I gave her the option of trying to latch her baby tonight, before I left. She chose to pump and bottle feed tonight. I haelped mom with pumping in standard setting, and she expresed 3 mls, with hand expression. Mom is still very edematous. I will follow up with her in the morning, before she and baby go home.  Patient Name: Stephanie Park [id number] Date: 12/02/2013 Reason for consult: Follow-up assessment   Maternal Data    Feeding    LATCH Score/Interventions                      Lactation Tools Discussed/Used     Consult Status Consult Status: Follow-up Date: 12/03/13 Follow-up type: In-patient    Stephanie Park 12/02/2013, 6:56 PM

## 2013-12-22 ENCOUNTER — Encounter (HOSPITAL_COMMUNITY): Payer: Self-pay | Admitting: Obstetrics & Gynecology

## 2014-02-16 ENCOUNTER — Encounter: Payer: Self-pay | Admitting: *Deleted

## 2014-02-17 ENCOUNTER — Encounter: Payer: Self-pay | Admitting: Obstetrics & Gynecology

## 2014-07-15 ENCOUNTER — Encounter (HOSPITAL_COMMUNITY): Payer: Self-pay | Admitting: *Deleted

## 2014-07-15 ENCOUNTER — Other Ambulatory Visit: Payer: Self-pay | Admitting: Obstetrics & Gynecology

## 2014-07-16 NOTE — Anesthesia Preprocedure Evaluation (Addendum)
Anesthesia Evaluation  Patient identified by MRN, date of birth, ID band Patient awake    Reviewed: Allergy & Precautions, NPO status , Patient's Chart, lab work & pertinent test results, reviewed documented beta blocker date and time   Airway Mallampati: II   Neck ROM: Full    Dental  (+) Teeth Intact, Dental Advisory Given   Pulmonary  breath sounds clear to auscultation        Cardiovascular hypertension, Pt. on medications Rhythm:Regular     Neuro/Psych  Headaches,    GI/Hepatic negative GI ROS, Neg liver ROS,   Endo/Other    Renal/GU negative Renal ROS     Musculoskeletal   Abdominal (+)  Abdomen: soft.    Peds  Hematology 11/33   Anesthesia Other Findings   Reproductive/Obstetrics                            Anesthesia Physical Anesthesia Plan  ASA: II  Anesthesia Plan: General and MAC   Post-op Pain Management:    Induction: Intravenous  Airway Management Planned: LMA and Natural Airway  Additional Equipment:   Intra-op Plan:   Post-operative Plan: Extubation in OR  Informed Consent: I have reviewed the patients History and Physical, chart, labs and discussed the procedure including the risks, benefits and alternatives for the proposed anesthesia with the patient or authorized representative who has indicated his/her understanding and acceptance.     Plan Discussed with:   Anesthesia Plan Comments:         Anesthesia Quick Evaluation

## 2014-07-20 NOTE — H&P (Addendum)
Stephanie Park is an 29 y.o. female. [id number] with LMP 05/02/14 who was found to have a missed abortion by ultrasound on 07/14/14 which showed intrauterine pregnancy consistent with 7 weeks 1 day EGA but no fetal heart beat present.  Patient desired surgical management by suction dilation and curretage (D & C) of the missed abortion.    Pertinent Gynecological History: Menses: 05/02/14 Contraception: none DES exposure: unknown Blood transfusions: none Sexually transmitted diseases: no past history Previous GYN Procedures: None  Last pap: Patient denies history of abnormal paps.  OB History: G2 P1011     Past Medical History  Diagnosis Date  . Headache(784.0)   . Fibroid   . Hypertension     PIH    Past Surgical History  Procedure Laterality Date  . No past surgeries      Family History  Problem Relation Age of Onset  . Kidney disease Father   . Hypertension Father   . Cancer Neg Hx   . Diabetes Maternal Grandmother   . Heart disease Neg Hx     Social History:  reports that she has never smoked. She has never used smokeless tobacco. She reports that she does not drink alcohol or use illicit drugs.  Allergies: No Known Allergies  No prescriptions prior to admission    ROS  Gen: Denies fevers/chills. Cardiovascular: Denies chest pain or palpitations.  Pulmonary: Denies cough or wheezing or shortness of breath. Genitourinary: Denies dysuria, urgency or frequency Gastrointestinal: Denies nausea or vomiting or diarrhea or abdominal pain Musculoskeletal: Patient denies muscle or joint pain or aches.   unknown if currently breastfeeding.   Physical Exam  Last office weight: 197 lbs.  Filed Vitals:   07/21/14 1342  BP: 130/74  Pulse: 91  Temp: 98.2 F (36.8 C)  Resp: 18   Gen: NAD CVS: s1, s2, RRR Pulm: CTAB Abd: Soft/Non-tender/non-distended Ext: warm and well purfused, no edema, no calf tenderness  Blood group:   A positive.   CBC    Component Value  Date/Time   WBC 13.5* 11/30/2013 0535   RBC 3.67* 11/30/2013 0535   HGB 11.0* 11/30/2013 0535   HCT 33.3* 11/30/2013 0535   PLT 142* 11/30/2013 0535   MCV 90.7 11/30/2013 0535   MCH 30.0 11/30/2013 0535   MCHC 33.0 11/30/2013 0535   RDW 16.1* 11/30/2013 0535   LYMPHSABS 2.2 04/28/2013 1349   MONOABS 0.4 04/28/2013 1349   EOSABS 0.2 04/28/2013 1349   BASOSABS 0.0 04/28/2013 1349   CBC    Component Value Date/Time   WBC 7.5 07/21/2014 1220   RBC 4.29 07/21/2014 1220   HGB 13.4 07/21/2014 1220   HCT 39.5 07/21/2014 1220   PLT 229 07/21/2014 1220   MCV 92.1 07/21/2014 1220   MCH 31.2 07/21/2014 1220   MCHC 33.9 07/21/2014 1220   RDW 14.1 07/21/2014 1220   LYMPHSABS 2.2 04/28/2013 1349   MONOABS 0.4 04/28/2013 1349   EOSABS 0.2 04/28/2013 1349   BASOSABS 0.0 04/28/2013 1349     Assessment/Plan: Patient with a first trimester missed abortion, for a suction D & C procedure - I discussed with patient risks, benefits and alternatives of a suction D & C including risks of bleeding, infection, damage to organs including Asherman's syndrome. All questions were answered and she was consented for the procedure.    [facility] WAKURU 07/20/2014, 4:21 PM

## 2014-07-21 ENCOUNTER — Encounter (HOSPITAL_COMMUNITY): Payer: Self-pay | Admitting: *Deleted

## 2014-07-21 ENCOUNTER — Ambulatory Visit (HOSPITAL_COMMUNITY): Payer: BLUE CROSS/BLUE SHIELD | Admitting: Anesthesiology

## 2014-07-21 ENCOUNTER — Ambulatory Visit (HOSPITAL_COMMUNITY)
Admission: RE | Admit: 2014-07-21 | Discharge: 2014-07-21 | Disposition: A | Discharge status: Home / Self Care | Payer: BLUE CROSS/BLUE SHIELD | Source: Ambulatory Visit | Attending: Obstetrics & Gynecology | Admitting: Obstetrics & Gynecology

## 2014-07-21 ENCOUNTER — Encounter (HOSPITAL_COMMUNITY)
Admission: RE | Disposition: A | Discharge status: Home / Self Care | Payer: Self-pay | Source: Ambulatory Visit | Attending: Obstetrics & Gynecology

## 2014-07-21 DIAGNOSIS — O021 Missed abortion: Secondary | ICD-10-CM

## 2014-07-21 DIAGNOSIS — I10 Essential (primary) hypertension: Secondary | ICD-10-CM | POA: Diagnosis not present

## 2014-07-21 HISTORY — PX: DILATION AND EVACUATION: SHX1459

## 2014-07-21 LAB — CBC
HEMATOCRIT: 39.5 % (ref 36.0–46.0)
Hemoglobin: 13.4 g/dL (ref 12.0–15.0)
MCH: 31.2 pg (ref 26.0–34.0)
MCHC: 33.9 g/dL (ref 30.0–36.0)
MCV: 92.1 fL (ref 78.0–100.0)
Platelets: 229 10*3/uL (ref 150–400)
RBC: 4.29 MIL/uL (ref 3.87–5.11)
RDW: 14.1 % (ref 11.5–15.5)
WBC: 7.5 10*3/uL (ref 4.0–10.5)

## 2014-07-21 SURGERY — DILATION AND EVACUATION, UTERUS
Anesthesia: Monitor Anesthesia Care

## 2014-07-21 MED ORDER — SCOPOLAMINE 1 MG/3DAYS TD PT72
1.0000 | MEDICATED_PATCH | Freq: Once | TRANSDERMAL | Status: DC
  Administered 2014-07-21: 1.5 mg via TRANSDERMAL

## 2014-07-21 MED ORDER — DEXTROSE 5 % IV SOLN
100.0000 mg | Freq: Once | INTRAVENOUS | Status: AC
  Administered 2014-07-21: 100 mg via INTRAVENOUS
  Filled 2014-07-21: qty 100

## 2014-07-21 MED ORDER — FENTANYL CITRATE (PF) 100 MCG/2ML IJ SOLN
INTRAMUSCULAR | Status: AC
  Filled 2014-07-21: qty 2

## 2014-07-21 MED ORDER — DEXAMETHASONE SODIUM PHOSPHATE 4 MG/ML IJ SOLN
INTRAMUSCULAR | Status: AC
  Filled 2014-07-21: qty 1

## 2014-07-21 MED ORDER — FENTANYL CITRATE (PF) 100 MCG/2ML IJ SOLN
INTRAMUSCULAR | Status: DC | PRN
  Administered 2014-07-21: 50 ug via INTRAVENOUS
  Administered 2014-07-21: 100 ug via INTRAVENOUS
  Administered 2014-07-21: 50 ug via INTRAVENOUS

## 2014-07-21 MED ORDER — LIDOCAINE HCL 2 % IJ SOLN
INTRAMUSCULAR | Status: AC
  Filled 2014-07-21: qty 20

## 2014-07-21 MED ORDER — MIDAZOLAM HCL 2 MG/2ML IJ SOLN
INTRAMUSCULAR | Status: DC | PRN
  Administered 2014-07-21: 2 mg via INTRAVENOUS

## 2014-07-21 MED ORDER — MIDAZOLAM HCL 2 MG/2ML IJ SOLN
INTRAMUSCULAR | Status: AC
  Filled 2014-07-21: qty 2

## 2014-07-21 MED ORDER — PROPOFOL 500 MG/50ML IV EMUL
INTRAVENOUS | Status: DC | PRN
  Administered 2014-07-21 (×2): 50 mg via INTRAVENOUS
  Administered 2014-07-21: 150 mg via INTRAVENOUS
  Administered 2014-07-21 (×3): 50 mg via INTRAVENOUS

## 2014-07-21 MED ORDER — LIDOCAINE HCL (CARDIAC) 20 MG/ML IV SOLN
INTRAVENOUS | Status: AC
  Filled 2014-07-21: qty 5

## 2014-07-21 MED ORDER — MEPERIDINE HCL 25 MG/ML IJ SOLN: 6.2500 mg | INTRAMUSCULAR | Status: DC | PRN

## 2014-07-21 MED ORDER — PROPOFOL 10 MG/ML IV BOLUS
INTRAVENOUS | Status: AC
  Filled 2014-07-21: qty 20

## 2014-07-21 MED ORDER — LIDOCAINE HCL 2 % IJ SOLN
INTRAMUSCULAR | Status: DC | PRN
  Administered 2014-07-21: 10 mL

## 2014-07-21 MED ORDER — DOXYCYCLINE HYCLATE 100 MG PO TABS: 100.0000 mg | ORAL_TABLET | Freq: Once | ORAL | Status: DC

## 2014-07-21 MED ORDER — PROMETHAZINE HCL 25 MG/ML IJ SOLN: 6.2500 mg | INTRAMUSCULAR | Status: DC | PRN

## 2014-07-21 MED ORDER — KETOROLAC TROMETHAMINE 30 MG/ML IJ SOLN
INTRAMUSCULAR | Status: DC | PRN
  Administered 2014-07-21: 30 mg via INTRAVENOUS

## 2014-07-21 MED ORDER — LACTATED RINGERS IV SOLN
INTRAVENOUS | Status: DC
  Administered 2014-07-21: 14:00:00 via INTRAVENOUS

## 2014-07-21 MED ORDER — LIDOCAINE HCL (CARDIAC) 20 MG/ML IV SOLN
INTRAVENOUS | Status: DC | PRN
  Administered 2014-07-21: 50 mg via INTRAVENOUS

## 2014-07-21 MED ORDER — FENTANYL CITRATE (PF) 100 MCG/2ML IJ SOLN: 25.0000 ug | INTRAMUSCULAR | Status: DC | PRN

## 2014-07-21 MED ORDER — LACTATED RINGERS IV SOLN
INTRAVENOUS | Status: DC
  Administered 2014-07-21: 16:00:00 via INTRAVENOUS

## 2014-07-21 MED ORDER — ONDANSETRON HCL 4 MG/2ML IJ SOLN
INTRAMUSCULAR | Status: DC | PRN
  Administered 2014-07-21: 4 mg via INTRAVENOUS

## 2014-07-21 MED ORDER — OXYCODONE-ACETAMINOPHEN 5-325 MG PO TABS: 1.0000 | ORAL_TABLET | ORAL | Status: DC | PRN

## 2014-07-21 MED ORDER — IBUPROFEN 600 MG PO TABS: 600.0000 mg | ORAL_TABLET | Freq: Four times a day (QID) | ORAL | Status: DC | PRN

## 2014-07-21 MED ORDER — ONDANSETRON HCL 4 MG/2ML IJ SOLN
INTRAMUSCULAR | Status: AC
  Filled 2014-07-21: qty 2

## 2014-07-21 MED ORDER — KETOROLAC TROMETHAMINE 30 MG/ML IJ SOLN
INTRAMUSCULAR | Status: AC
  Filled 2014-07-21: qty 1

## 2014-07-21 MED ORDER — SCOPOLAMINE 1 MG/3DAYS TD PT72
MEDICATED_PATCH | TRANSDERMAL | Status: AC
  Administered 2014-07-21: 1.5 mg via TRANSDERMAL
  Filled 2014-07-21: qty 1

## 2014-07-21 MED ORDER — MEDROXYPROGESTERONE ACETATE 150 MG/ML IM SUSP: 150.0000 mg | INTRAMUSCULAR | Status: DC

## 2014-07-21 SURGICAL SUPPLY — 19 items
CATH ROBINSON RED A/P 16FR (CATHETERS) ×2 IMPLANT
CLOTH BEACON ORANGE TIMEOUT ST (SAFETY) ×2 IMPLANT
DECANTER SPIKE VIAL GLASS SM (MISCELLANEOUS) ×2 IMPLANT
GLOVE BIO SURGEON STRL SZ 6.5 (GLOVE) ×2 IMPLANT
GLOVE BIOGEL PI IND STRL 7.0 (GLOVE) ×1 IMPLANT
GLOVE BIOGEL PI INDICATOR 7.0 (GLOVE) ×1
GOWN STRL REUS W/TWL LRG LVL3 (GOWN DISPOSABLE) ×4 IMPLANT
KIT BERKELEY 1ST TRIMESTER 3/8 (MISCELLANEOUS) ×2 IMPLANT
NS IRRIG 1000ML POUR BTL (IV SOLUTION) ×2 IMPLANT
PACK VAGINAL MINOR WOMEN LF (CUSTOM PROCEDURE TRAY) ×2 IMPLANT
PAD OB MATERNITY 4.3X12.25 (PERSONAL CARE ITEMS) ×2 IMPLANT
PAD PREP 24X48 CUFFED NSTRL (MISCELLANEOUS) ×2 IMPLANT
SET BERKELEY SUCTION TUBING (SUCTIONS) ×2 IMPLANT
SYR 20CC LL (SYRINGE) ×2 IMPLANT
TOWEL OR 17X24 6PK STRL BLUE (TOWEL DISPOSABLE) ×4 IMPLANT
VACURETTE 10 RIGID CVD (CANNULA) IMPLANT
VACURETTE 7MM CVD STRL WRAP (CANNULA) ×2 IMPLANT
VACURETTE 8 RIGID CVD (CANNULA) IMPLANT
VACURETTE 9 RIGID CVD (CANNULA) IMPLANT

## 2014-07-21 NOTE — Discharge Instructions (Signed)
DISCHARGE INSTRUCTIONS: D&C / D&E The following instructions have been prepared to help you care for yourself upon your return home.   Personal hygiene:  Use sanitary pads for vaginal drainage, not tampons.  Shower the day after your procedure.  NO tub baths, pools or Jacuzzis for 2-3 weeks.  Wipe front to back after using the bathroom.  Activity and limitations:  Do NOT drive or operate any equipment for 24 hours. The effects of anesthesia are still present and drowsiness may result.  Do NOT rest in bed all day.  Walking is encouraged.  Walk up and down stairs slowly.  You may resume your normal activity in one to two days or as indicated by your physician.  Sexual activity: NO intercourse for at least 2 weeks after the procedure, or as indicated by your physician.  Diet: Eat a light meal as desired this evening. You may resume your usual diet tomorrow.  Return to work: You may resume your work activities in one to two days or as indicated by your doctor.  What to expect after your surgery: Expect to have vaginal bleeding/discharge for 2-3 days and spotting for up to 10 days. It is not unusual to have soreness for up to 1-2 weeks. You may have a slight burning sensation when you urinate for the first day. Mild cramps may continue for a couple of days. You may have a regular period in 2-6 weeks.  NO IBUPROFEN PRODUCTS (MOTRIN, ADVIL) OR ALEVE UNTIL 9:30PM TODAY.   Call your doctor for any of the following:  Excessive vaginal bleeding, saturating and changing one pad every hour.  Inability to urinate 6 hours after discharge from hospital.  Pain not relieved by pain medication.  Fever of 100.4 F or greater.  Unusual vaginal discharge or odor.   Call for an appointment:    Patients signature: ______________________  Nurses signature ________________________  Support person's signature_______________________

## 2014-07-21 NOTE — Anesthesia Postprocedure Evaluation (Signed)
  Anesthesia Post-op Note  Patient: Stephanie Park  Procedure(s) Performed: Procedure(s): DILATATION AND EVACUATION (N/A)  Patient Location: PACU  Anesthesia Type:General  Level of Consciousness: awake, alert  and oriented  Airway and Oxygen Therapy: Patient Spontanous Breathing  Post-op Pain: none  Post-op Assessment: Post-op Vital signs reviewed, Patient's Cardiovascular Status Stable, Respiratory Function Stable, Patent Airway, No signs of Nausea or vomiting and Pain level controlled  Post-op Vital Signs: Reviewed and stable  Last Vitals:  Filed Vitals:   07/21/14 1645  BP: 132/84  Pulse: 77  Temp: 36.6 C  Resp: 16    Complications: No apparent anesthesia complications

## 2014-07-21 NOTE — Anesthesia Procedure Notes (Signed)
Procedure Name: LMA Insertion Date/Time: 07/21/2014 3:25 PM Performed by: Tobin Chad Pre-anesthesia Checklist: Patient identified, Emergency Drugs available, Suction available, Patient being monitored and Timeout performed Patient Re-evaluated:Patient Re-evaluated prior to inductionOxygen Delivery Method: Circle system utilized Preoxygenation: Pre-oxygenation with 100% oxygen Intubation Type: IV induction LMA Size: 4.0 Number of attempts: 1 Dental Injury: Teeth and Oropharynx as per pre-operative assessment

## 2014-07-21 NOTE — Op Note (Signed)
Patient'sMarland Kitchen Stephanie Park, Stephanie Park  [id number]  Date of birth: [birth year]  Date of procedure: 07/21/2014  Procedure: Suction Dilation and Currettage.  Preop diagnosis: Missed abortion at 7 weeks 1 day EGA.    Postop diagnosis: Missed abortion at 7 weeks 1 day EGA.  Surgeon: Dr. Waymon Amato.  Anesthesia: IV sedation converted to General LMA.  Complications: None  Input: 800 cc LR  Output: EBL: 75cc (including products of conception).    Findings: 7 week size anteverted uterus. No palpable adnexal masses bilaterally. Cervix closed.     Indications: 29 year-old G2 para 1011 who was found to have a missed abortion by an ultrasound that showed no fetal heartbeat of intrauterine pregnancy.   The patient's desired surgical management of the missed abortion. We discussed risks benefits and alternatives of the procedure. She is known blood group A positive  Procedure:  Informed consent was obtained from the patient to undergo the procedure. She was taken to the operating room anesthesia was administered without difficulty. Straight catheterization was performed.  She was placed in the dorsal lithotomy position and prepped and draped in the usual sterile fashion. An exam under anesthesia revealed the above findings.  Doxycycline 100 mg IV was given at this time.    A speculum and anterior retractor were used to view the cervix. The Allis clamp was used to hold the cervix in place.  The cervix was dilated serially with the [facility] dilators up to #21 dilator. The suction curette #7 curved was then advanced and the uterus cleared of the products of conception with 3 passes of the suction curette.  Gentle sharp curettage was then performed and the 4 quadrants were noted to be gritty.  The suction curette was once again passed into the uterus to clear it of the remaining products of conception.  The instruments were then removed and the patient was then awoken from anesthesia.  She was cleaned and taken to  recovery room in stable condition  Specimen: Products of conception.

## 2014-07-21 NOTE — Transfer of Care (Signed)
Immediate Anesthesia Transfer of Care Note  Patient: Stephanie Park  Procedure(s) Performed: Procedure(s): DILATATION AND EVACUATION (N/A)  Patient Location: PACU  Anesthesia Type:General  Level of Consciousness: awake, alert  and oriented  Airway & Oxygen Therapy: Patient Spontanous Breathing and Patient connected to nasal cannula oxygen  Post-op Assessment: Report given to RN and Post -op Vital signs reviewed and stable  Post vital signs: Reviewed and stable  Last Vitals:  Filed Vitals:   07/21/14 1342  BP: 130/74  Pulse: 91  Temp: 36.8 C  Resp: 18    Complications: No apparent anesthesia complications

## 2014-07-21 NOTE — Brief Op Note (Signed)
07/21/2014  4:19 PM  PATIENT:  Stephanie Park  29 y.o. female  PRE-OPERATIVE DIAGNOSIS:  Missed AB  POST-OPERATIVE DIAGNOSIS:  Missed AB  PROCEDURE:  Procedure(s): DILATATION AND EVACUATION (N/A)  SURGEON:  Surgeon(s) and Role:    * Waymon Amato, MD - Primary   ANESTHESIA:   IV sedation converted to General LMA.   EBL:  Total I/O In: 800 [I.V.:800] Out: 95 [Urine:20; Blood:75]  BLOOD ADMINISTERED:none  DRAINS: none   LOCAL MEDICATIONS USED:  LIDOCAINE   SPECIMEN:  Source of Specimen:  Products of conception  DISPOSITION OF SPECIMEN:  PATHOLOGY  COUNTS:  YES  TOURNIQUET:  * No tourniquets in log *  DICTATION: .Dragon Dictation  PLAN OF CARE: Discharge to home after PACU  PATIENT DISPOSITION:  PACU - hemodynamically stable.   Delay start of Pharmacological VTE agent (>24hrs) due to surgical blood loss or risk of bleeding: not applicable

## 2014-07-22 ENCOUNTER — Encounter (HOSPITAL_COMMUNITY): Payer: Self-pay | Admitting: Obstetrics & Gynecology

## 2014-08-05 ENCOUNTER — Inpatient Hospital Stay (HOSPITAL_COMMUNITY)
Admission: AD | Admit: 2014-08-05 | Discharge: 2014-08-06 | Disposition: A | Discharge status: Home / Self Care | Payer: BLUE CROSS/BLUE SHIELD | Source: Ambulatory Visit | Attending: Obstetrics and Gynecology | Admitting: Obstetrics and Gynecology

## 2014-08-05 ENCOUNTER — Inpatient Hospital Stay (HOSPITAL_COMMUNITY): Payer: BLUE CROSS/BLUE SHIELD

## 2014-08-05 ENCOUNTER — Encounter (HOSPITAL_COMMUNITY): Payer: Self-pay | Admitting: Obstetrics and Gynecology

## 2014-08-05 DIAGNOSIS — E669 Obesity, unspecified: Secondary | ICD-10-CM

## 2014-08-05 DIAGNOSIS — O021 Missed abortion: Secondary | ICD-10-CM | POA: Insufficient documentation

## 2014-08-05 DIAGNOSIS — R109 Unspecified abdominal pain: Secondary | ICD-10-CM | POA: Insufficient documentation

## 2014-08-05 DIAGNOSIS — R1030 Lower abdominal pain, unspecified: Secondary | ICD-10-CM

## 2014-08-05 DIAGNOSIS — O209 Hemorrhage in early pregnancy, unspecified: Secondary | ICD-10-CM | POA: Diagnosis present

## 2014-08-05 LAB — CBC WITH DIFFERENTIAL/PLATELET
BASOS PCT: 0 % (ref 0–1)
Basophils Absolute: 0 10*3/uL (ref 0.0–0.1)
EOS ABS: 0.1 10*3/uL (ref 0.0–0.7)
EOS PCT: 1 % (ref 0–5)
HCT: 36.8 % (ref 36.0–46.0)
Hemoglobin: 12.9 g/dL (ref 12.0–15.0)
Lymphocytes Relative: 14 % (ref 12–46)
Lymphs Abs: 1.3 10*3/uL (ref 0.7–4.0)
MCH: 31.5 pg (ref 26.0–34.0)
MCHC: 35.1 g/dL (ref 30.0–36.0)
MCV: 90 fL (ref 78.0–100.0)
MONO ABS: 0.3 10*3/uL (ref 0.1–1.0)
Monocytes Relative: 3 % (ref 3–12)
NEUTROS ABS: 7.1 10*3/uL (ref 1.7–7.7)
Neutrophils Relative %: 82 % — ABNORMAL HIGH (ref 43–77)
Platelets: 241 10*3/uL (ref 150–400)
RBC: 4.09 MIL/uL (ref 3.87–5.11)
RDW: 13.2 % (ref 11.5–15.5)
WBC: 8.8 10*3/uL (ref 4.0–10.5)

## 2014-08-05 LAB — HCG, QUANTITATIVE, PREGNANCY: hCG, Beta Chain, Quant, S: 1160 m[IU]/mL — ABNORMAL HIGH (ref <5)

## 2014-08-05 LAB — TYPE AND SCREEN
ABO/RH(D): A POS
Antibody Screen: NEGATIVE

## 2014-08-05 MED ORDER — DIPHENHYDRAMINE HCL 50 MG/ML IJ SOLN
25.0000 mg | Freq: Once | INTRAMUSCULAR | Status: AC
  Administered 2014-08-05: 25 mg via INTRAVENOUS

## 2014-08-05 MED ORDER — DIPHENHYDRAMINE HCL 50 MG/ML IJ SOLN
INTRAMUSCULAR | Status: AC
  Administered 2014-08-05: 25 mg via INTRAVENOUS
  Filled 2014-08-05: qty 1

## 2014-08-05 MED ORDER — HYDROMORPHONE HCL 1 MG/ML IJ SOLN
1.0000 mg | INTRAMUSCULAR | Status: DC | PRN
  Administered 2014-08-05: 1 mg via INTRAVENOUS
  Filled 2014-08-05: qty 1

## 2014-08-05 MED ORDER — NALOXONE HCL 0.4 MG/ML IJ SOLN
0.4000 mg | Freq: Once | INTRAMUSCULAR | Status: DC
  Filled 2014-08-05: qty 1

## 2014-08-05 MED ORDER — NALOXONE HCL 0.4 MG/ML IJ SOLN: 0.4000 mg | INTRAMUSCULAR | Status: DC | PRN

## 2014-08-05 MED ORDER — LACTATED RINGERS IV SOLN
INTRAVENOUS | Status: DC
  Administered 2014-08-05: 19:00:00 via INTRAVENOUS

## 2014-08-05 NOTE — Discharge Instructions (Signed)

## 2014-08-05 NOTE — Progress Notes (Signed)
Patient was given Dilaudid 1 mg IV, immediately started having difficulty breathing, notified Jennifer Rasch NP, stridor, O2 10 Liters applied via mask, Benadryl administered IV, Dr. Royce Macadamia present.

## 2014-08-05 NOTE — MAU Provider Note (Signed)
History   29 yo G2P1011 s/p D&E on 5/31 for missed AB at 7 1/7 weeks presented after calling office to report increased bleeding and pain since this am.  Had scheduled D&E with Dr. Alesia Richards on 2/83, without complication, for missed AB noted on NOB visit 07/09/14.  Seen in office 6/7 for periumbilical pain, with possible small periumbilical hernia noted. Thought this current bleeding was her cycle, but it has not responded to Tylenol and usual measures.  Had no bleeding after D&C, then had onset of small amount bleeding late last week, continuing and now heavier.  Has changed pads several times this afternoon.  Denies syncope or dizziness, no N/V.  Took "2 600 mg Ibuprophens" early this afternoon, without benefit.    Patient Active Problem List   Diagnosis Date Noted  . Abdominal cramping s/p D&C 08/05/2014  . Missed abortion--D&E 07/21/14 07/21/2014  . Obesity 11/26/2013  . Fibroids, subserous 04/28/2013    Filed Vitals:   08/05/14 1759  BP: 141/91  Pulse: 82  Temp: 97.9 F (36.6 C)  Resp: 20   HPI:  See above  OB History    Gravida Para Term Preterm AB TAB SAB Ectopic Multiple Living   2 1 1  0 1 0 1 0 0 1      Past Medical History  Diagnosis Date  . Fibroid   . Vaginal delivery 2015    Past Surgical History  Procedure Laterality Date  . No past surgeries    . Dilation and evacuation N/A 07/21/2014    Procedure: DILATATION AND EVACUATION;  Surgeon: Waymon Amato, MD;  Location: [facility];  Service: Gynecology;  Laterality: N/A;    Family History  Problem Relation Age of Onset  . Kidney disease Father   . Hypertension Father   . Cancer Neg Hx   . Diabetes Maternal Grandmother   . Heart disease Neg Hx     History  Substance Use Topics  . Smoking status: Never Smoker   . Smokeless tobacco: Never Used  . Alcohol Use: No    Allergies: No Known Allergies  Prescriptions prior to admission  Medication Sig Dispense Refill Last Dose  . doxycycline (VIBRA-TABS) 100 MG tablet  Take 1 tablet (100 mg total) by mouth once. 1 tablet 0   . ibuprofen (ADVIL,MOTRIN) 600 MG tablet Take 1 tablet (600 mg total) by mouth every 6 (six) hours as needed for mild pain or moderate pain. 30 tablet 0   . medroxyPROGESTERone (DEPO-PROVERA) 150 MG/ML injection Inject 1 mL (150 mg total) into the muscle every 3 (three) months. 1 mL 3   . oxyCODONE-acetaminophen (ROXICET) 5-325 MG per tablet Take 1 tablet by mouth every 4 (four) hours as needed for severe pain. 30 tablet 0   . Prenatal Vit-Fe Fumarate-FA (PRENATAL MULTIVITAMIN) TABS tablet Take 1 tablet by mouth daily at 12 noon.   Past Week at Unknown time    ROS:  Cramping, bleeding Physical Exam   Physical Exam  In moderate distress from cramping. Chest clear Heart RRR without murmur Abd--no periumbilical tenderness.  Moderate pain in LLQ, with mild rebound, with pain extending across midline to right. Pelvic--small amount dark bleeding, mild uterine tenderness to palpation Ext WNL  ED Course  Assessment: S/p D&E 07/21/14 s/p missed AB at 7 1/7 weeks New onset moderate bleeding and cramping. Rh positive  Plan: IV LR QHCG, CBC, diff Pelvic US Dilaudid 1 mg IV Dr. Cletis Media updated on patient status. Will await test results.   Taneil Lazarus,  Kizzie Furnish, MSN 08/05/2014 6:10 PM   Addendum: Called to see patient s/p moderate anaphylaxis rxn to Dilaudid dose, with stridor and "throat closing up" rxn.   O2 on per face mask. O2 sat WNL Dr. Royce Macadamia in, with Benadryl given IV. I ordered Narcan, but patient improved after Benadryl, so Narcan was deferred at present.  Patient alert and oriented at present--no SOB now. Still has cramping.  Filed Vitals:   08/05/14 1759 08/05/14 1801 08/05/14 1850 08/05/14 1904  BP: 141/91  154/91 152/87  Pulse: 82  112 68  Temp: 97.9 F (36.6 C)     TempSrc: Oral     Resp: 20  20 20   Height: 5' 1.5" (1.562 m)     Weight: 86.183 kg (190 lb)     SpO2:  100% 100% 100%   No hx  hypertension--initial BP on arrival was 141/91.  Report to [facility], CNM, who will f/u with Dr. Margie Billet as on-coming MD.  Donnel Saxon, CNM 08/05/14 7:28p

## 2014-08-05 NOTE — Progress Notes (Signed)
Ultrasound at bedside to perform ordered US

## 2014-08-05 NOTE — MAU Note (Addendum)
Started having cramps yesterday in the evening- took Ibuprofen and went to sleep; cramping started again this afternoon; when got home- took 2 Ibuprofen (600mg ).  Office instructed her to come in.  Started bleeding last wk on Monday.   Had a D&C at the end of May

## 2014-08-05 NOTE — MAU Provider Note (Signed)
MAU Addendum Note  Care assumed from [place] Not active distress noted.  Pt sleeping. VVS.  Sat 100 on room air. Still c/o of abd pain 5/10 an will still like pain meds. Report from office visit 07/27/13 - -DISCUSSED ETIOLOGIES OF PAIN COULD BE MUSCULOSKELETAL PAIN FROM SPRAIN OR FRICTION IN AREA, DUE TO CONSTIPATION OR UMBULICAL HERNIA. SHE DENIES REFLUX SYMPTOMS. ADVISED IBUPROFEN, MIRALAX, COLACE USE. OFFERED REFERRAL TO GEN SURGERY FOR POSSIBLE HERNIA FOLLOW UP, SHE PREFERS TO DEFER REFERRAL FOR NOW, WILL TRY OTHER MANAGEMENT OPTIONS IF NOT RESOLVING SHE WILL CALL OFFICE TO GET SURGERY REFERRAL. PAIN AND HERNIA PRECAUTIONS REVIEWED.    Filed Vitals:   08/05/14 1904 08/05/14 1925 08/05/14 1946 08/05/14 2001  BP: 152/87 136/74 136/71 129/76  Pulse: 68 77 64 76  Temp:      TempSrc:      Resp: 20 16  16   Height:      Weight:      SpO2: 100% 100% 100% 100%    Results for orders placed or performed during the hospital encounter of 08/05/14 (from the past 24 hour(s))  CBC with Differential/Platelet     Status: Abnormal   Collection Time: 08/05/14  6:31 PM  Result Value Ref Range   WBC 8.8 4.0 - 10.5 K/uL   RBC 4.09 3.87 - 5.11 MIL/uL   Hemoglobin 12.9 12.0 - 15.0 g/dL   HCT 36.8 36.0 - 46.0 %   MCV 90.0 78.0 - 100.0 fL   MCH 31.5 26.0 - 34.0 pg   MCHC 35.1 30.0 - 36.0 g/dL   RDW 13.2 11.5 - 15.5 %   Platelets 241 150 - 400 K/uL   Neutrophils Relative % 82 (H) 43 - 77 %   Neutro Abs 7.1 1.7 - 7.7 K/uL   Lymphocytes Relative 14 12 - 46 %   Lymphs Abs 1.3 0.7 - 4.0 K/uL   Monocytes Relative 3 3 - 12 %   Monocytes Absolute 0.3 0.1 - 1.0 K/uL   Eosinophils Relative 1 0 - 5 %   Eosinophils Absolute 0.1 0.0 - 0.7 K/uL   Basophils Relative 0 0 - 1 %   Basophils Absolute 0.0 0.0 - 0.1 K/uL  hCG, quantitative, pregnancy     Status: Abnormal   Collection Time: 08/05/14  6:31 PM  Result Value Ref Range   hCG, Beta Chain, Quant, S 1160 (H) <5 mIU/mL  Type and screen     Status: None    Collection Time: 08/05/14  6:31 PM  Result Value Ref Range   ABO/RH(D) A POS    Antibody Screen NEG    Sample Expiration 08/08/2014     US IMPRESSION: 1. Complex heterogeneous and echogenic material distends the endometrial cavity in the mid and lower uterine segment. There is no evidence of internal blood flow on color Doppler to suggest retained products of conception. This may represent a clot. A submucosal uterine fibroid is a less likely possibility. 2. In the fundus, posterior to the finding described above there is a smaller volume of more simple free fluid. 3. 2.3 cm peripherally calcified fibroid in the right aspect of the fundus. 4. Unremarkable ovaries.   Plan: Consulted with Dr. Simona Huh Waiting for orders   Javia Dillow, CNM, MSN 08/05/2014. 9:15 PM   Addendum 1100 Pt reports feeling much better, pain decreased to 3/10 and she wants to go home   Addendum 1118 DC to home in stable condition Pt encouraged to be seen in the office  this week for FU Pt not comfortable taking percocet but is willing to take tylenol for pain

## 2014-08-05 NOTE — MAU Note (Signed)
Urine in lab 

## 2014-11-23 IMAGING — US US OB COMP LESS 14 WK
1 series · 14 of 28 positions shown · non-contrast
Comparison: None.

CLINICAL DATA: Pregnancy with inconclusive fetal viability

EXAM:
OBSTETRIC <14 WK ULTRASOUND
TECHNIQUE: Transabdominal ultrasound was performed for evaluation of the
gestation as well as the maternal uterus and adnexal regions.

[Series 1: us ob comp less 14 wks · 43 acquisitions, 14 frames shown]
[im 2/43]
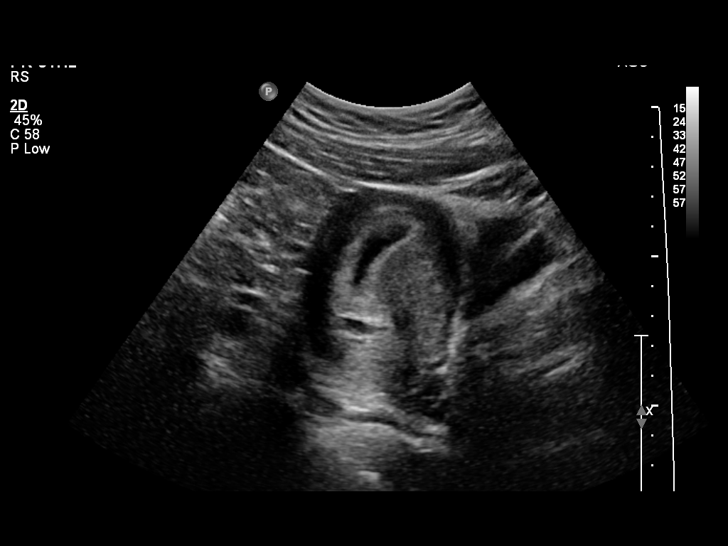
[im 5/43]
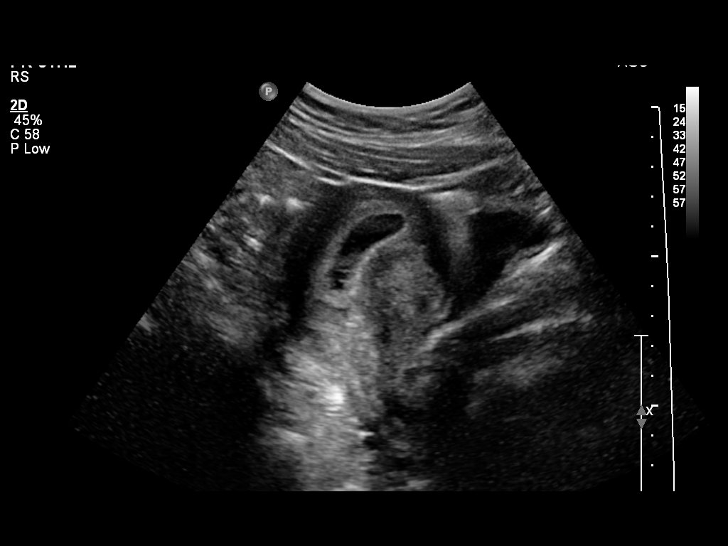
[im 8/43]
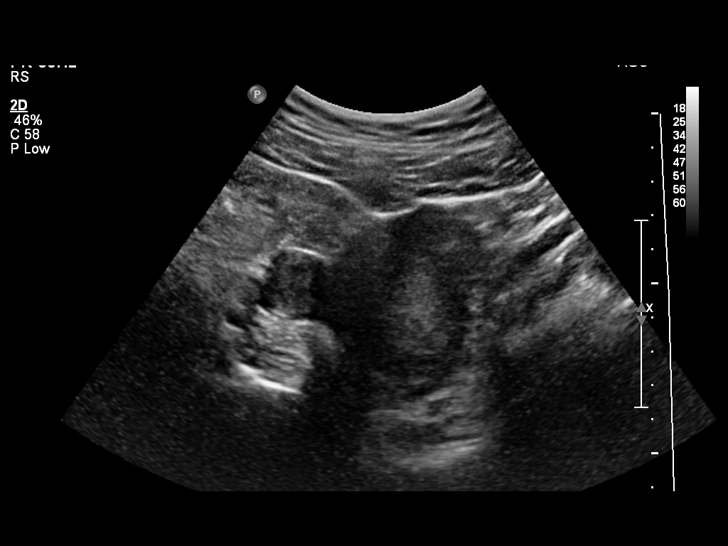
[im 11/43]
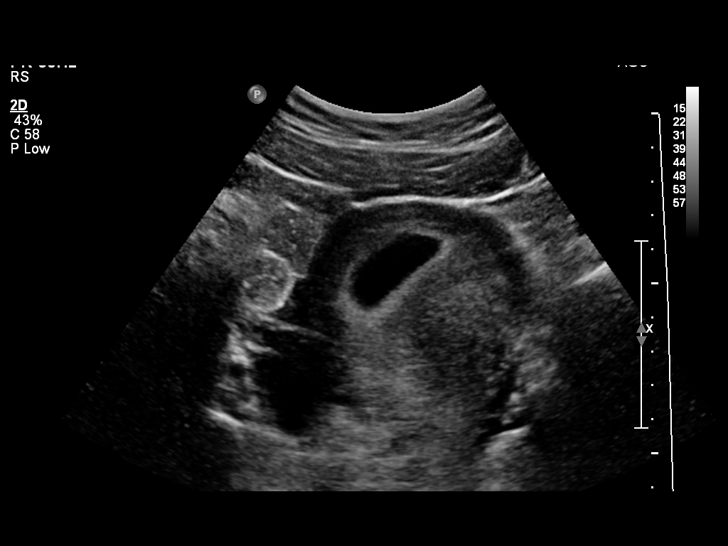
[im 15/43]
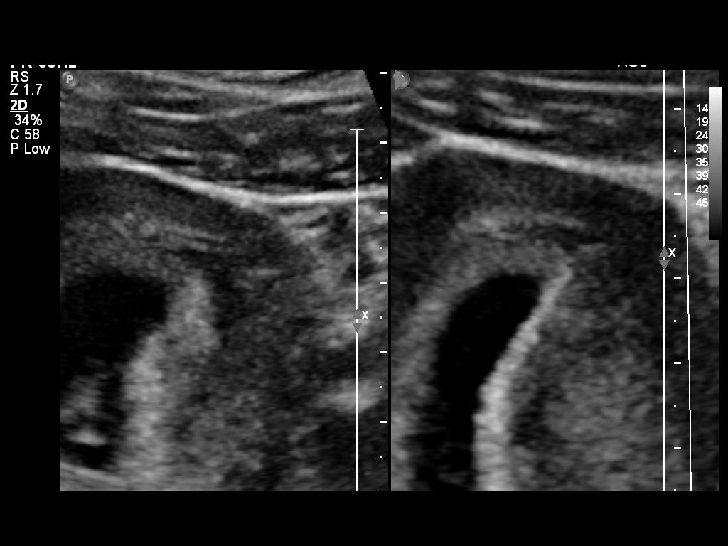
[im 18/43]
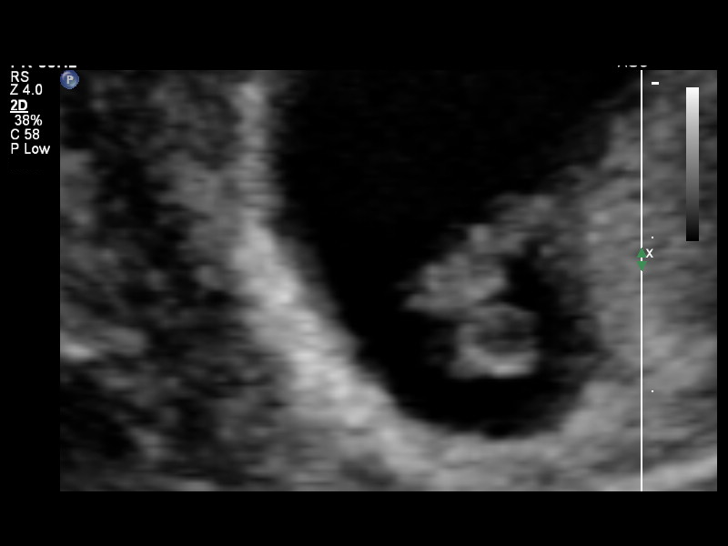
[im 21/43]
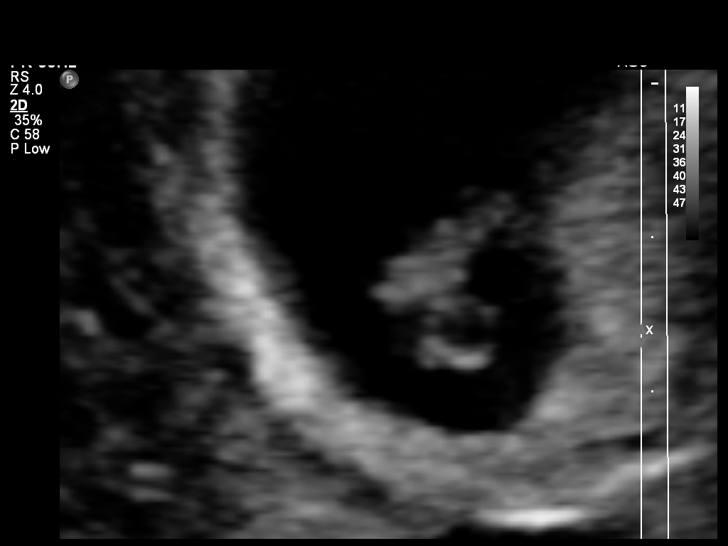
[im 24/43]
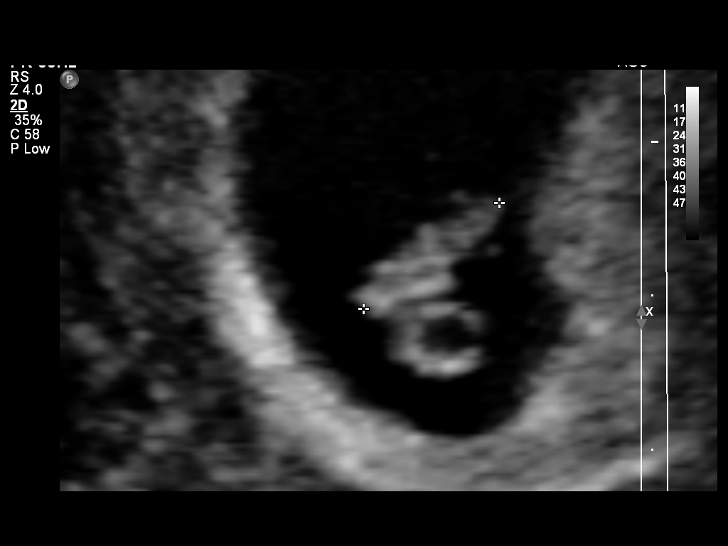
[im 27/43]
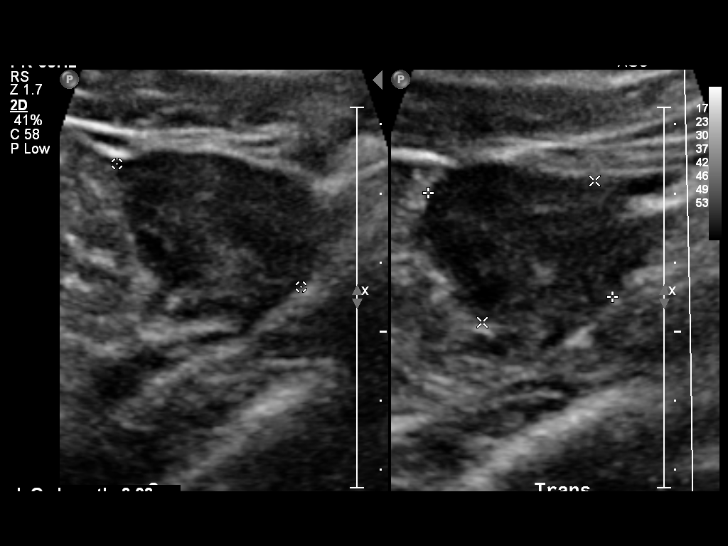
[im 30/43]
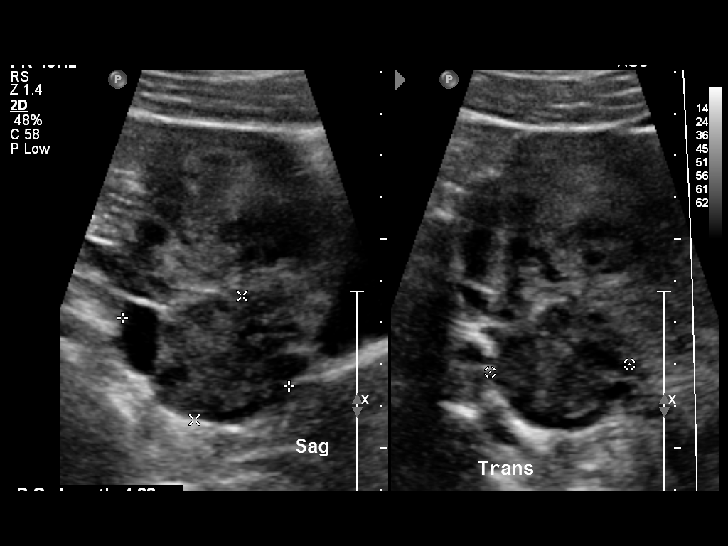
[im 33/43]
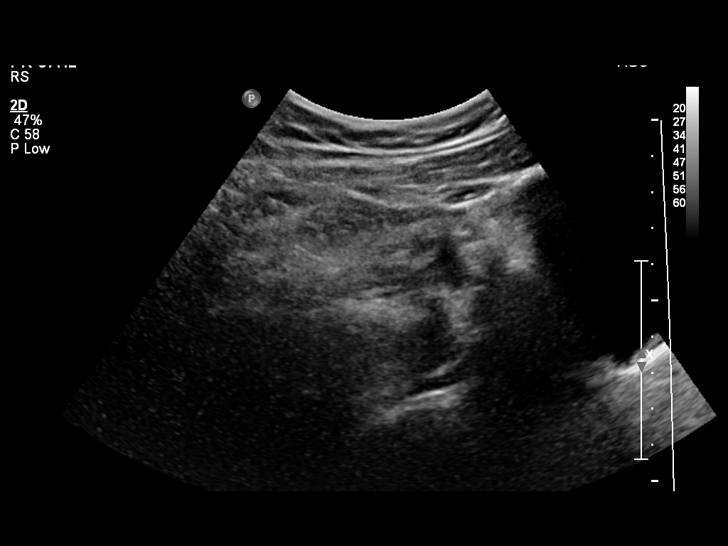
[im 36/43]
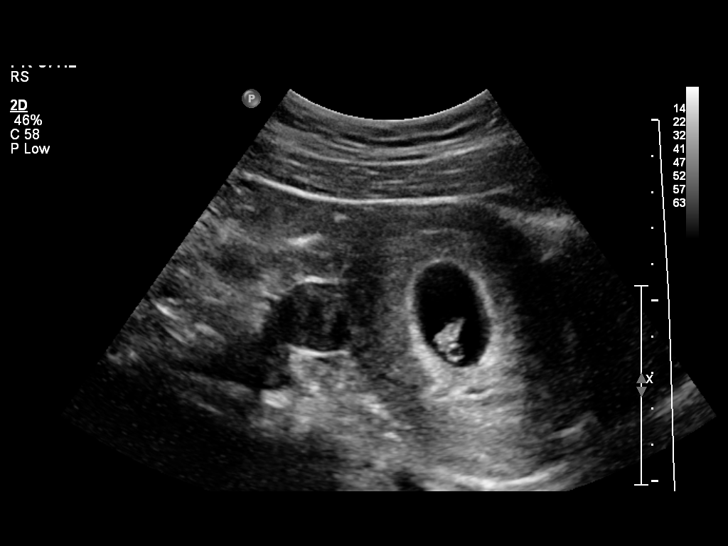
[im 39/43]
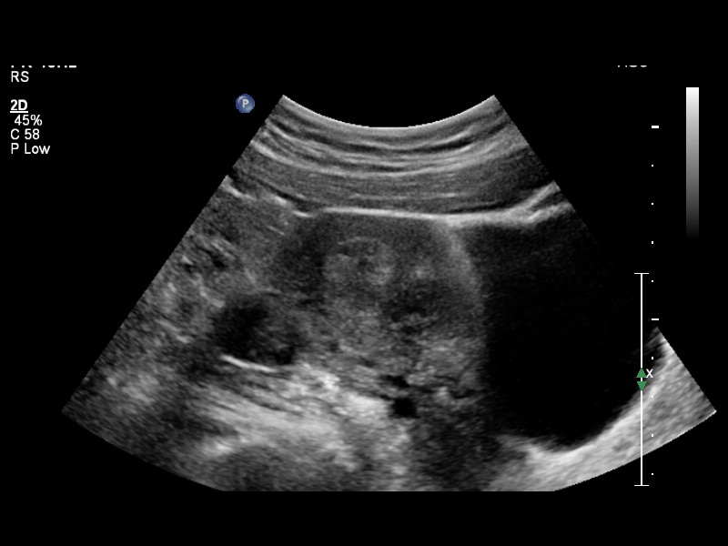
[im 43/43]
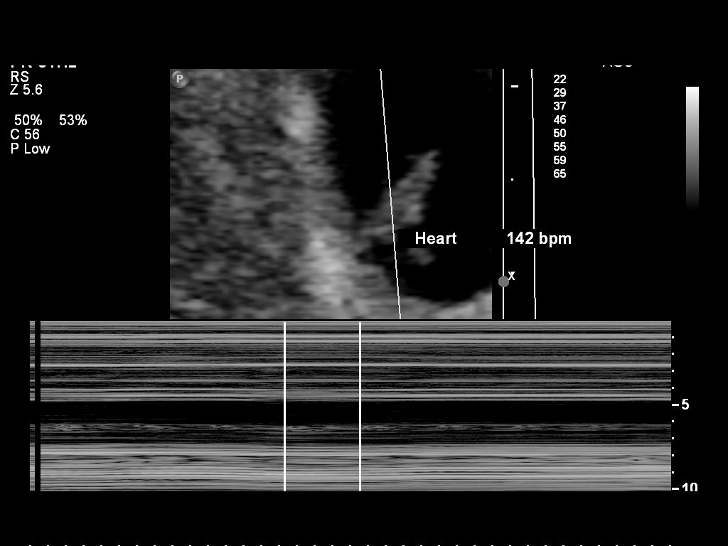

[14 of 28 positions shown; findings below may reference images not displayed]

FINDINGS: Intrauterine gestational sac: Visualized/normal in shape.

Yolk sac:  Present

Embryo:  Present

Cardiac Activity: Present

Heart Rate: 142 bpm

CRL:   10.4  mm   7 w 1 d                  US EDC: 11/27/2013

Maternal uterus/adnexae: Small subchorionic hemorrhage.

Two pedunculated right uterine fibroids measuring 2.5 x 2.1 x 1.8 cm
and 2.1 x 2.2 x 1.9 cm.

Right ovary is within normal limits, measuring 4.3 x 3.2 x 3.3 cm.

Left ovary is within normal limits, measuring 3.0 x 2.6 x 3.2 cm.

No free fluid.
IMPRESSION: Single live intrauterine gestation with estimated gestational age 7
weeks 1 day by crown-rump length.

## 2015-02-21 NOTE — L&D Delivery Note (Signed)
Delivery Note At  a viable female was delivered via  (Presentation:ROA ;  ).  APGAR:4 ,9 ; weight pending  .   Placenta status:complete, 3V.  Cord:  with the following complications: light mec slow to cry taken to warmer for evaluation. Anesthesia:  Epidural Episiotomy:  None Lacerations:  None Est. Blood Loss (mL):  100cc  Mom to postpartum.  Baby to Couplet care / Skin to Skin.  [name] Prothero 12/05/2015, 11:36 AM

## 2015-04-28 LAB — OB RESULTS CONSOLE RPR: RPR: NONREACTIVE

## 2015-04-28 LAB — OB RESULTS CONSOLE RUBELLA ANTIBODY, IGM: Rubella: IMMUNE

## 2015-04-28 LAB — OB RESULTS CONSOLE HEPATITIS B SURFACE ANTIGEN: Hepatitis B Surface Ag: NEGATIVE

## 2015-04-28 LAB — OB RESULTS CONSOLE ABO/RH: RH Type: POSITIVE

## 2015-04-28 LAB — OB RESULTS CONSOLE GC/CHLAMYDIA
Chlamydia: NEGATIVE
Gonorrhea: NEGATIVE

## 2015-04-28 LAB — OB RESULTS CONSOLE HIV ANTIBODY (ROUTINE TESTING): HIV: NONREACTIVE

## 2015-04-28 LAB — OB RESULTS CONSOLE ANTIBODY SCREEN: ANTIBODY SCREEN: NEGATIVE

## 2015-11-17 LAB — OB RESULTS CONSOLE GBS: STREP GROUP B AG: NEGATIVE

## 2015-12-05 ENCOUNTER — Inpatient Hospital Stay (HOSPITAL_COMMUNITY)
Admission: AD | Admit: 2015-12-05 | Discharge: 2015-12-07 | DRG: 775 | Disposition: A | Discharge status: Home / Self Care | Payer: Medicaid Other | Source: Ambulatory Visit | Attending: Obstetrics and Gynecology | Admitting: Obstetrics and Gynecology

## 2015-12-05 ENCOUNTER — Inpatient Hospital Stay (HOSPITAL_COMMUNITY): Payer: Medicaid Other | Admitting: Anesthesiology

## 2015-12-05 ENCOUNTER — Encounter (HOSPITAL_COMMUNITY): Payer: Self-pay | Admitting: *Deleted

## 2015-12-05 DIAGNOSIS — Z8249 Family history of ischemic heart disease and other diseases of the circulatory system: Secondary | ICD-10-CM | POA: Diagnosis not present

## 2015-12-05 DIAGNOSIS — Z3A38 38 weeks gestation of pregnancy: Secondary | ICD-10-CM

## 2015-12-05 DIAGNOSIS — Z833 Family history of diabetes mellitus: Secondary | ICD-10-CM

## 2015-12-05 DIAGNOSIS — Z6838 Body mass index (BMI) 38.0-38.9, adult: Secondary | ICD-10-CM | POA: Diagnosis not present

## 2015-12-05 DIAGNOSIS — Z889 Allergy status to unspecified drugs, medicaments and biological substances status: Secondary | ICD-10-CM

## 2015-12-05 DIAGNOSIS — O99214 Obesity complicating childbirth: Secondary | ICD-10-CM | POA: Diagnosis present

## 2015-12-05 DIAGNOSIS — Z3483 Encounter for supervision of other normal pregnancy, third trimester: Secondary | ICD-10-CM | POA: Diagnosis present

## 2015-12-05 LAB — CBC
HEMATOCRIT: 37.4 % (ref 36.0–46.0)
HEMOGLOBIN: 12.8 g/dL (ref 12.0–15.0)
MCH: 30.3 pg (ref 26.0–34.0)
MCHC: 34.2 g/dL (ref 30.0–36.0)
MCV: 88.6 fL (ref 78.0–100.0)
Platelets: 163 10*3/uL (ref 150–400)
RBC: 4.22 MIL/uL (ref 3.87–5.11)
RDW: 14.6 % (ref 11.5–15.5)
WBC: 7.8 10*3/uL (ref 4.0–10.5)

## 2015-12-05 LAB — TYPE AND SCREEN
ABO/RH(D): A POS
Antibody Screen: NEGATIVE

## 2015-12-05 LAB — RPR: RPR: NONREACTIVE

## 2015-12-05 MED ORDER — COCONUT OIL OIL: 1.0000 "application " | TOPICAL_OIL | Status: DC | PRN

## 2015-12-05 MED ORDER — BENZOCAINE-MENTHOL 20-0.5 % EX AERO: 1.0000 "application " | INHALATION_SPRAY | CUTANEOUS | Status: DC | PRN

## 2015-12-05 MED ORDER — SOD CITRATE-CITRIC ACID 500-334 MG/5ML PO SOLN: 30.0000 mL | ORAL | Status: DC | PRN

## 2015-12-05 MED ORDER — HYDROXYZINE HCL 50 MG PO TABS
50.0000 mg | ORAL_TABLET | Freq: Four times a day (QID) | ORAL | Status: DC | PRN
  Filled 2015-12-05: qty 1

## 2015-12-05 MED ORDER — LACTATED RINGERS IV SOLN
500.0000 mL | INTRAVENOUS | Status: DC | PRN
  Administered 2015-12-05: 1000 mL via INTRAVENOUS

## 2015-12-05 MED ORDER — DIPHENHYDRAMINE HCL 25 MG PO CAPS
25.0000 mg | ORAL_CAPSULE | Freq: Four times a day (QID) | ORAL | Status: DC | PRN
Start: 2015-12-05 — End: 2015-12-07

## 2015-12-05 MED ORDER — FLEET ENEMA 7-19 GM/118ML RE ENEM: 1.0000 | ENEMA | RECTAL | Status: DC | PRN

## 2015-12-05 MED ORDER — LIDOCAINE HCL (PF) 1 % IJ SOLN
INTRAMUSCULAR | Status: DC | PRN
  Administered 2015-12-05 (×2): 7 mL via EPIDURAL

## 2015-12-05 MED ORDER — PHENYLEPHRINE 40 MCG/ML (10ML) SYRINGE FOR IV PUSH (FOR BLOOD PRESSURE SUPPORT)
80.0000 ug | PREFILLED_SYRINGE | INTRAVENOUS | Status: DC | PRN
  Filled 2015-12-05: qty 5

## 2015-12-05 MED ORDER — SENNOSIDES-DOCUSATE SODIUM 8.6-50 MG PO TABS
2.0000 | ORAL_TABLET | ORAL | Status: DC
  Administered 2015-12-05 – 2015-12-06 (×2): 2 via ORAL
  Filled 2015-12-05 (×2): qty 2

## 2015-12-05 MED ORDER — ONDANSETRON HCL 4 MG PO TABS: 4.0000 mg | ORAL_TABLET | ORAL | Status: DC | PRN

## 2015-12-05 MED ORDER — TERBUTALINE SULFATE 1 MG/ML IJ SOLN
0.2500 mg | Freq: Once | INTRAMUSCULAR | Status: DC | PRN
  Filled 2015-12-05: qty 1

## 2015-12-05 MED ORDER — EPHEDRINE 5 MG/ML INJ
10.0000 mg | INTRAVENOUS | Status: DC | PRN
  Filled 2015-12-05: qty 4

## 2015-12-05 MED ORDER — WITCH HAZEL-GLYCERIN EX PADS: 1.0000 "application " | MEDICATED_PAD | CUTANEOUS | Status: DC | PRN

## 2015-12-05 MED ORDER — PHENYLEPHRINE 40 MCG/ML (10ML) SYRINGE FOR IV PUSH (FOR BLOOD PRESSURE SUPPORT)
80.0000 ug | PREFILLED_SYRINGE | INTRAVENOUS | Status: DC | PRN
  Filled 2015-12-05: qty 10
  Filled 2015-12-05: qty 5

## 2015-12-05 MED ORDER — PRENATAL MULTIVITAMIN CH: 1.0000 | ORAL_TABLET | Freq: Every day | ORAL | Status: DC

## 2015-12-05 MED ORDER — FENTANYL 2.5 MCG/ML BUPIVACAINE 1/10 % EPIDURAL INFUSION (WH - ANES)
INTRAMUSCULAR | Status: AC
  Filled 2015-12-05: qty 125

## 2015-12-05 MED ORDER — ONDANSETRON HCL 4 MG/2ML IJ SOLN: 4.0000 mg | Freq: Four times a day (QID) | INTRAMUSCULAR | Status: DC | PRN

## 2015-12-05 MED ORDER — IBUPROFEN 600 MG PO TABS
600.0000 mg | ORAL_TABLET | Freq: Four times a day (QID) | ORAL | Status: DC
  Administered 2015-12-05 – 2015-12-07 (×7): 600 mg via ORAL
  Filled 2015-12-05 (×7): qty 1

## 2015-12-05 MED ORDER — SODIUM BICARBONATE 8.4 % IV SOLN
INTRAVENOUS | Status: DC | PRN
  Administered 2015-12-05: 8 mL via EPIDURAL

## 2015-12-05 MED ORDER — ACETAMINOPHEN 325 MG PO TABS
650.0000 mg | ORAL_TABLET | ORAL | Status: DC | PRN
  Administered 2015-12-06: 650 mg via ORAL
  Filled 2015-12-05: qty 2

## 2015-12-05 MED ORDER — FAMOTIDINE 20 MG PO TABS
20.0000 mg | ORAL_TABLET | Freq: Every day | ORAL | Status: DC
  Administered 2015-12-06 – 2015-12-07 (×2): 20 mg via ORAL
  Filled 2015-12-05 (×2): qty 1

## 2015-12-05 MED ORDER — ACETAMINOPHEN 325 MG PO TABS: 650.0000 mg | ORAL_TABLET | ORAL | Status: DC | PRN

## 2015-12-05 MED ORDER — DIPHENHYDRAMINE HCL 50 MG/ML IJ SOLN: 12.5000 mg | INTRAMUSCULAR | Status: DC | PRN

## 2015-12-05 MED ORDER — OXYTOCIN BOLUS FROM INFUSION
500.0000 mL | Freq: Once | INTRAVENOUS | Status: AC
  Administered 2015-12-05: 500 mL via INTRAVENOUS

## 2015-12-05 MED ORDER — FENTANYL 2.5 MCG/ML BUPIVACAINE 1/10 % EPIDURAL INFUSION (WH - ANES)
14.0000 mL/h | INTRAMUSCULAR | Status: DC | PRN
Start: 2015-12-05 — End: 2015-12-05
  Administered 2015-12-05: 14 mL/h via EPIDURAL

## 2015-12-05 MED ORDER — ZOLPIDEM TARTRATE 5 MG PO TABS: 5.0000 mg | ORAL_TABLET | Freq: Every evening | ORAL | Status: DC | PRN

## 2015-12-05 MED ORDER — TETANUS-DIPHTH-ACELL PERTUSSIS 5-2.5-18.5 LF-MCG/0.5 IM SUSP: 0.5000 mL | Freq: Once | INTRAMUSCULAR | Status: DC

## 2015-12-05 MED ORDER — PRENATAL MULTIVITAMIN CH
1.0000 | ORAL_TABLET | Freq: Every day | ORAL | Status: DC
  Administered 2015-12-06: 1 via ORAL
  Filled 2015-12-05: qty 1

## 2015-12-05 MED ORDER — FENTANYL CITRATE (PF) 100 MCG/2ML IJ SOLN: 50.0000 ug | INTRAMUSCULAR | Status: DC | PRN

## 2015-12-05 MED ORDER — OXYTOCIN 40 UNITS IN LACTATED RINGERS INFUSION - SIMPLE MED
1.0000 m[IU]/min | INTRAVENOUS | Status: DC
  Administered 2015-12-05: 2 m[IU]/min via INTRAVENOUS
  Filled 2015-12-05: qty 1000

## 2015-12-05 MED ORDER — OXYTOCIN 40 UNITS IN LACTATED RINGERS INFUSION - SIMPLE MED: 2.5000 [IU]/h | INTRAVENOUS | Status: DC

## 2015-12-05 MED ORDER — LIDOCAINE HCL (PF) 1 % IJ SOLN
30.0000 mL | INTRAMUSCULAR | Status: DC | PRN
  Filled 2015-12-05: qty 30

## 2015-12-05 MED ORDER — ONDANSETRON HCL 4 MG/2ML IJ SOLN: 4.0000 mg | INTRAMUSCULAR | Status: DC | PRN

## 2015-12-05 MED ORDER — SIMETHICONE 80 MG PO CHEW: 80.0000 mg | CHEWABLE_TABLET | ORAL | Status: DC | PRN

## 2015-12-05 MED ORDER — LACTATED RINGERS IV SOLN
INTRAVENOUS | Status: DC
  Administered 2015-12-05: 08:00:00 via INTRAVENOUS

## 2015-12-05 MED ORDER — LACTATED RINGERS IV SOLN
500.0000 mL | Freq: Once | INTRAVENOUS | Status: DC
Start: 2015-12-05 — End: 2015-12-05

## 2015-12-05 NOTE — Lactation Note (Signed)
This note was copied from a baby's chart. Lactation Consultation Note  Patient Name: Stephanie Park [id number] Date: 12/05/2015 Reason for consult: Initial assessment   Initial consult with Exp BF mom of 59 hour old infant. Infant with 2 BF for 10-30 minutes and 1 stool since delivery. Herminio Heads MSF at delivery. LATCH Scores 7-8 by bedside RN.   Mom was glad to see lactation. She was concerned infant was hungry because he is eating frequently. Mom concerned infant not getting enough and how to tell she is getting enough. Discussed supply and demand colostrum and milk coming to volume. Showed mom how to hand express and colostrum was visible, she practiced hand expression. Assisted mom in latching infant to left breast in the cross cradle hold hold. Infant latched easily with flanged lips, rhythmic suckling and intermittent swallows. Bf basics reviewed with mom. Discussed with mom importance of head and pillow support with feeding and discussed cross cradle and football holds. Enc mom to feed 8-12 x in 24 hours at first feeding cues. Discussed normalcy of cluster feeding.   BF Resources Handout and LC Brochure Given, mom informed of IP/OP Services, BF Support Groups and [place] phone #. Enc mom to call out to desk for feeding assistance as needed. Follow up tomorrow and prn.      Maternal Data Formula Feeding for Exclusion: Yes Reason for exclusion: Mother's choice to formula and breast feed on admission Has patient been taught Hand Expression?: Yes Does the patient have breastfeeding experience prior to this delivery?: Yes  Feeding Feeding Type: Breast Fed  LATCH Score/Interventions Latch: Grasps breast easily, tongue down, lips flanged, rhythmical sucking.  Audible Swallowing: A few with stimulation Intervention(s): Hand expression;Skin to skin Intervention(s): Alternate breast massage;Hand expression;Skin to skin  Type of Nipple: Everted at rest and after stimulation  Comfort  (Breast/Nipple): Soft / non-tender     Hold (Positioning): Assistance needed to correctly position infant at breast and maintain latch. Intervention(s): Breastfeeding basics reviewed;Support Pillows;Position options;Skin to skin  LATCH Score: 8  Lactation Tools Discussed/Used WIC Program: Yes   Consult Status Consult Status: Follow-up Date: 12/06/15 Follow-up type: In-patient    Stephanie Park 12/05/2015, 7:43 PM

## 2015-12-05 NOTE — MAU Note (Signed)
ctxs since 0130. Denies LOF or bleeding. 1cm last sve

## 2015-12-05 NOTE — Anesthesia Postprocedure Evaluation (Signed)
Anesthesia Post Note  Patient: Stephanie Park  Procedure(s) Performed: * No procedures listed *  Patient location during evaluation: Mother Baby Anesthesia Type: Epidural Level of consciousness: awake and alert Pain management: pain level controlled Vital Signs Assessment: post-procedure vital signs reviewed and stable Respiratory status: spontaneous breathing, nonlabored ventilation and respiratory function stable Cardiovascular status: stable Postop Assessment: no headache, no backache, epidural receding and patient able to bend at knees Anesthetic complications: no     Last Vitals:  Vitals:   12/05/15 1350 12/05/15 1450  BP: 129/65 125/62  Pulse: 76 76  Resp: 20 18  Temp: 37 C 36.8 C    Last Pain:  Vitals:   12/05/15 1749  TempSrc:   PainSc: 0-No pain   Pain Goal: Patients Stated Pain Goal: 4 (12/05/15 0728)               Rayvon Char

## 2015-12-05 NOTE — Anesthesia Pain Management Evaluation Note (Signed)
  CRNA Pain Management Visit Note  Patient: Stephanie Park, 30 y.o., female  "Hello I am a member of the anesthesia team at [facility]. We have an anesthesia team available at all times to provide care throughout the hospital, including epidural management and anesthesia for C-section. I don't know your plan for the delivery whether it a natural birth, water birth, IV sedation, nitrous supplementation, doula or epidural, but we want to meet your pain goals."   1.Was your pain managed to your expectations on prior hospitalizations?   Yes   2.What is your expectation for pain management during this hospitalization?     Epidural  3.How can we help you reach that goal? Epidural  Record the patient's initial score and the patient's pain goal.   Pain: 2  Pain Goal: 1 [facility] wants you to be able to say your pain was always managed very well.  Reeder Brisby 12/05/2015

## 2015-12-05 NOTE — Anesthesia Procedure Notes (Signed)
Epidural Patient location during procedure: OB Start time: 12/05/2015 7:47 AM End time: 12/05/2015 7:55 AM  Staffing Anesthesiologist: Lyn Hollingshead Performed: anesthesiologist   Preanesthetic Checklist Completed: patient identified, surgical consent, pre-op evaluation, timeout performed, IV checked, risks and benefits discussed and monitors and equipment checked  Epidural Patient position: sitting Prep: site prepped and draped and DuraPrep Patient monitoring: continuous pulse ox and blood pressure Approach: midline Location: L3-L4 Injection technique: LOR air  Needle:  Needle type: Tuohy  Needle gauge: 17 G Needle length: 9 cm and 9 Needle insertion depth: 6 cm Catheter type: closed end flexible Catheter size: 19 Gauge Catheter at skin depth: 11 cm Test dose: negative and Other  Assessment Sensory level: T9 Events: blood not aspirated, injection not painful, no injection resistance, negative IV test and no paresthesia  Additional Notes Reason for block:procedure for pain

## 2015-12-05 NOTE — Anesthesia Preprocedure Evaluation (Addendum)
Anesthesia Evaluation  Patient identified by MRN, date of birth, ID band Patient awake    Reviewed: Allergy & Precautions, H&P , NPO status , Patient's Chart, lab work & pertinent test results  Airway Mallampati: II  TM Distance: >3 FB Neck ROM: full    Dental no notable dental hx.    Pulmonary neg pulmonary ROS,    Pulmonary exam normal breath sounds clear to auscultation       Cardiovascular negative cardio ROS Normal cardiovascular exam     Neuro/Psych negative neurological ROS  negative psych ROS   GI/Hepatic negative GI ROS, Neg liver ROS,   Endo/Other  Morbid obesity  Renal/GU negative Renal ROS     Musculoskeletal   Abdominal (+) + obese,   Peds  Hematology negative hematology ROS (+)   Anesthesia Other Findings   Reproductive/Obstetrics (+) Pregnancy                             Anesthesia Physical Anesthesia Plan  ASA: II  Anesthesia Plan: Epidural   Post-op Pain Management:    Induction:   Airway Management Planned: Natural Airway  Additional Equipment:   Intra-op Plan:   Post-operative Plan:   Informed Consent: I have reviewed the patients History and Physical, chart, labs and discussed the procedure including the risks, benefits and alternatives for the proposed anesthesia with the patient or authorized representative who has indicated his/her understanding and acceptance.     Plan Discussed with:   Anesthesia Plan Comments:        Anesthesia Quick Evaluation

## 2015-12-05 NOTE — H&P (Signed)
Stephanie Park is a 30 y.o. female, G3P1011 @ 38.4 wks (as dated by 6.1 wk sono) presents w/ c/o irregular, yet painful ctxs. Was 1 cm in the office last week, 3 cm on arrival, and 4 cm after ambulating x 1-2 hrs. +FM. Denies LOF or VB.  Pregnancy followed at [facility] since 28 Apr 2015 and significant for:  1) Uterine Leiomyoma 2) Recurrent UTI - suppression not indicated 3) Obesity - BMI 38.4 4) Allergy to Dilaudid    OB History    Gravida Para Term Preterm AB Living   3 1 1  0 1 1   SAB TAB Ectopic Multiple Live Births   1 0 0 0 1    SVB 11/29/2013 @ 40.2 wks, female infant, birthwt 7+8 SAB 06/29/14 @ 7.4 wks  Past Medical History:  Diagnosis Date  . Fibroid   . Vaginal delivery 2015   Past Surgical History:  Procedure Laterality Date  . DILATION AND EVACUATION N/A 07/21/2014   Procedure: DILATATION AND EVACUATION;  Surgeon: Waymon Amato, MD;  Location: [facility];  Service: Gynecology;  Laterality: N/A;  . NO PAST SURGERIES     Family History: family history includes Diabetes in her maternal grandmother; Hypertension in her father; Kidney disease in her father. Social History:  reports that she has never smoked. She has never used smokeless tobacco. She reports that she does not drink alcohol or use drugs.     Maternal Diabetes: No Genetic Screening: Declined Maternal Ultrasounds/Referrals: Normal Fetal Ultrasounds or other Referrals:  None Maternal Substance Abuse:  No Significant Maternal Medications:  Meds include: Other: PNV Significant Maternal Lab Results:  Lab values include: Group B Strep negative, Hbg 11.8 at 28 wks Other Comments:  Received Tdap on 09/23/15  ROS 10 Systems reviewed and are negative for acute change except as noted in the HPI.  History Maternal Exam:  Uterine Assessment: Contraction strength is mild.  Contraction duration is 45 seconds. Contraction frequency is irregular.   Abdomen: Fundal height is CWD.   Estimated fetal weight is 7 1/2 lbs.   Fetal  presentation: vertex  Pelvis: adequate for delivery.   Cervix: Cervix evaluated by digital exam.     Fetal Exam Fetal Monitor Review: Mode: fetoscope.   Baseline rate: 140 bpm.  Variability: moderate (6-25 bpm).   Pattern: accelerations present and no decelerations.    Fetal State Assessment: Category I - tracings are normal.      Dilation: 4 Effacement (%): 80 Station: -2 Exam by:: Alycia Rossetti RN @ [phone] Blood pressure 132/84, pulse 93, temperature 97.9 F (36.6 C), resp. rate 20, height 5\' 2"  (1.575 m), weight 95.9 kg (211 lb 6.4 oz), unknown if currently breastfeeding.  Physical Exam  Nursing noteand vitalsreviewed. Neurological: She has normal reflexes.  Constitutional: She is oriented to person, place, and time. She appears well-developed and well-nourished.  HENT:  Head: Normocephalic and atraumatic.  Neck: Normal range of motion.  Cardiovascular: Normal rate, regular rhythm and normal heart sounds.  Respiratory: Effort normal and breath sounds normal.  GI: Soft. Bowel sounds are normal. Abdomen is gravid.  Neurological: She is alert and oriented to person, place, and time.  Skin: Skin is warm and dry.  Musculoskeletal: Exhibits 1+ lower extremity edema. Psychiatric: She has a normal mood and affect. Her behavior is normal.   Prenatal labs: ABO, Rh: A/Positive/-- (03/08 0000) Antibody: Negative (03/08 0000) Rubella: Immune (03/08 0000) RPR: Nonreactive (03/08 0000)  HBsAg: Negative (03/08 0000)  HIV: Non-reactive (03/08 0000)  GBS: Neg (11/17/15)  Results for orders placed or performed during the hospital encounter of 12/05/15 (from the past 24 hour(s))  CBC     Status: None   Collection Time: 12/05/15  6:43 AM  Result Value Ref Range   WBC 7.8 4.0 - 10.5 K/uL   RBC 4.22 3.87 - 5.11 MIL/uL   Hemoglobin 12.8 12.0 - 15.0 g/dL   HCT 37.4 36.0 - 46.0 %   MCV 88.6 78.0 - 100.0 fL   MCH 30.3 26.0 - 34.0 pg   MCHC 34.2 30.0 - 36.0 g/dL   RDW 14.6 11.5 -  15.5 %   Platelets 163 150 - 400 K/uL  Type and screen [place]     Status: None   Collection Time: 12/05/15  6:43 AM  Result Value Ref Range   ABO/RH(D) A POS    Antibody Screen NEG    Sample Expiration 12/08/2015     Assessment: IUP at 38.4 wks  Latent phase - making cervical change w/ ctxs Cat 1 FHRT GBS neg Dilaudid allergy Obesity  Plan: Admit to [facility]. Routine [facility] orders.  Expectant management for now. Pain med/epidural prn. Expect SVD.   Farrel Gordon 12/05/2015, 6:33 AM

## 2015-12-05 NOTE — Progress Notes (Signed)
Subjective: Pt comfortable with epidural.     Objective: BP 132/84 (BP Location: Right Arm)   Pulse 93   Temp 98 F (36.7 C) (Oral)   Resp 20   Ht 5\' 2"  (1.575 m)   Wt 95.7 kg (211 lb)   BMI 38.59 kg/m  No intake/output data recorded. No intake/output data recorded.  FHT: Category 1 UC:   regular, every 3 minutes SVE:   Dilation: 5 Effacement (%): 100 Station: -2 Exam by:: patti moore rn Pitocin at 4 mu AROM light mec fluid Assessment:  Pt is a [id number] 38.4 IUP in active labor Cat 1 strip   Plan: Anticipate NSVD  Starla Link CNM, MSN 12/05/2015, 9:56 AM

## 2015-12-06 LAB — CBC
HEMATOCRIT: 37.2 % (ref 36.0–46.0)
HEMOGLOBIN: 12.6 g/dL (ref 12.0–15.0)
MCH: 30.3 pg (ref 26.0–34.0)
MCHC: 33.9 g/dL (ref 30.0–36.0)
MCV: 89.4 fL (ref 78.0–100.0)
Platelets: 165 10*3/uL (ref 150–400)
RBC: 4.16 MIL/uL (ref 3.87–5.11)
RDW: 14.8 % (ref 11.5–15.5)
WBC: 9.9 10*3/uL (ref 4.0–10.5)

## 2015-12-06 NOTE — Progress Notes (Signed)
UR chart review completed.  

## 2015-12-06 NOTE — Progress Notes (Signed)
Mom breast and bottle on admission; requesting formula because "even after I feed him, he is fussy and doesn't seem content." Discussed with mom on her plan to continue breastfeeding, desires to continue trying but to supplement with formula from bottle. Educated mom on supply and demand, nipple confusion, feeding guidelines, bottle preparation. Mom verbalizes understanding.

## 2015-12-06 NOTE — Progress Notes (Signed)
I agree with [facility] RN academy assesments.

## 2015-12-06 NOTE — Progress Notes (Signed)
Post Partum Day 1  Subjective: no complaints, up ad lib, voiding and tolerating PO  Objective: Blood pressure 116/67, pulse 83, temperature 98.3 F (36.8 C), temperature source Oral, resp. rate 20, height 5\' 2"  (1.575 m), weight 211 lb (95.7 kg), SpO2 100 %, unknown if currently breastfeeding.  Physical Exam:  General: alert and no distress Lochia: appropriate Uterine Fundus: firm Incision: n/a DVT Evaluation: No evidence of DVT seen on physical exam.   Recent Labs  12/05/15 0643 12/06/15 0524  HGB 12.8 12.6  HCT 37.4 37.2    Assessment/Plan: Plan for discharge tomorrow Cont routine PP care   LOS: 1 day   Nishant Schrecengost Y 12/06/2015, 1:31 PM

## 2015-12-06 NOTE — Lactation Note (Signed)
This note was copied from a baby's chart.  Lactation Consultation Note  Room full of visitors.  Mother states she feels she has more milk supply. No concerns regarding bf.  She states baby recently bf for 25 min and RN observed. Mom encouraged to feed baby 8-12 times/24 hours and with feeding cues.  Suggest she call if she would like assistance w/ bf.    Patient Name: Stephanie Park [id number] Date: 12/06/2015 Reason for consult: Follow-up assessment   Maternal Data    Feeding Feeding Type: Breast Fed Length of feed: 20 min  LATCH Score/Interventions                      Lactation Tools Discussed/Used     Consult Status Consult Status: Follow-up Date: 12/07/15 Follow-up type: In-patient    Vivianne Master [facility] 12/06/2015, 6:25 PM

## 2015-12-07 MED ORDER — IBUPROFEN 600 MG PO TABS: 600.0000 mg | ORAL_TABLET | Freq: Four times a day (QID) | ORAL | 0 refills | Status: AC

## 2015-12-07 NOTE — Discharge Instructions (Signed)
Home Care Instructions for Mom °After discharge, you may discover that you still have questions about body changes, activity, and care during the next few weeks. The following information should be helpful in answering many of your questions. °ACTIVITY °· Gradually resume your daily activities at home. °· Allow time for rest periods during the day and nap while your newborn sleeps. °· Avoid heavy lifting (more than 10 lb [4.5 kg]) and strenuous work or sports. Slow to moderate walking is usually safe. °· If you had a cesarean delivery, do not vacuum, climb stairs, or drive a car for 4 to 6 weeks. °· If you had a cesarean delivery, make arrangements for help at home until you feel you are okay to do your usual activities yourself. °· Ask your caregiver for safe exercises to do after delivery, especially if you had a cesarean delivery. °VAGINAL FLOW AND RETURN OF YOUR MENSTRUAL PERIOD °· Vaginal flow may continue for 4 to 6 weeks after delivery. °· Usually the amount decreases and the color of blood gets lighter. °· Bright red blood and an increased flow may reoccur if you have been too active. °· Lie down, raise (elevate) your feet, place a cold compress on your lower abdomen, rest, and call your caregiver if you are soaking more than 1 pad an hour or passing large clots. °· Menstrual periods will usually return 6 to 8 weeks after delivery. °· If you are breastfeeding, your period will return anywhere from 8 weeks to the time you stop breastfeeding. °PERINEAL CARE °· Use the peri bottle and change pads each time you go to the bathroom. °· Use towelettes in place of toilet paper until stitches are healed. °· Take warm tub baths for 15 to 20 minutes. °· Continue to use medicated pads or pain relieving spray. °· Lidocaine cream for episiotomy pain can be used with your caregiver's approval. °· Do not use tampons or douches until vaginal bleeding has stopped (about 4 weeks). °· Sexual intercourse should be avoided for at  least 3 to 4 weeks after delivery or until the brownish-red vaginal flow is completely gone. °· Wipe from front to back. °INCISION CARE (AFTER A CESAREAN DELIVERY) °· Shower as desired. Try to keep your incision dry. °BOWELS AND HEMORRHOIDS °· Drink at least 6 to 8 glasses of non-caffeinated fluids per day. °· Eat fiber-rich diet with whole grains, raw fruits, and vegetables. °· Take frequent warm tub baths if hemorrhoids are a problem. °· Avoid straining when having a bowel movement. °· Over-the-counter medicines and stool softeners can be used as directed by your caregiver. °NUTRITION °· Eat a well-balanced diet that includes the basic food groups. °· Do not try to lose weight quickly by cutting back on calories. °· If you are breastfeeding, drink at least 8 to 10 glasses of non-caffeinated fluid per day and increase your intake by 600 calories a day. °· Take your prenatal vitamins until your postpartum checkup or until your caregiver tells you to stop. °BREASTFEEDING °If you are not breastfeeding: °· Wear a good tight-fitting bra. °· Limit fluid intake after 1 or 2 days after delivery, or as directed, if your breasts are becoming engorged. °· Avoid nipple stimulation and apply cool (not icy cold) compresses to the breasts for comfort as needed. °¨ Avoid drinking alcohol and caffeinated drinks. °· Mild over-the-counter pain medication recommended by your caregiver is helpful for breast discomfort. °· Medications to dry up breast milk are not recommended. °If you are breastfeeding: °· Encourage   your newborn to breastfeed if you think he or she is hungry. °· Wash your hands before breastfeeding. °· Clean your breasts with warm water before nursing. °· Start to encourage feeding 8 to 12 times a day for 10 to 15 minutes on each breast in the beginning to help stimulate milk production and train your newborn. °· Avoid water and formula supplements for your newborn unless otherwise directed. °· Have your newborn seen by  his or her caregiver 3 to 5 days after delivery and again at 2 to 3 weeks to evaluate his or her progress with breastfeeding. °· Call your newborn's caregiver if you think he or she is not gaining weight or may be losing weight. °POSTPARTUM BLUES °Following delivery, your body goes through a drastic change in hormone levels. You may find yourself crying for no apparent reason and unable to cope with all the changes a newborn brings. Get support from your partner and friends. Give yourself time to adjust. If these feelings persist after several weeks, contact your caregiver or other professionals that can help you. °Call your local emergency department, go to the emergency room, or get help right away from a relative, friend, or neighbor if you feel you are about to harm yourself, your newborn, or anyone else. °EXERCISES °Start Kegel exercises right after delivery. You can do it while standing, sitting, or lying down. Tighten your stomach muscles and the muscles surrounding your birth canal. Hold for a few seconds and then relax. Repeat 5 times each time. Make Kegel exercises a part of your daily routine to maintain the muscle tone that supports your vagina, bladder, and bowels. °SELF BREAST EXAMINATION °· Do breast self-exams at the same time of the month, each month. °· Any lump, bump, or discharge should be reported to your caregiver. °· Check your breasts, if you are breastfeeding, just after a feeding when your breasts are less full. If your period has started and you are breastfeeding, check your breasts on the fifth to seventh day of your period. °· Breasts are normally lumpy if you are breastfeeding due to the fullness of the milk cells. This is temporary and not a health risk. °INTIMACY AND SEXUALITY °New parents need time to adjust to each other intimately and sexually after giving birth. Try to spend time as a couple discussing ways to adjust to your infant, your new schedule, and how to meet both your  desires and needs. Counseling can be helpful in deeply troubled cases. °If you are breastfeeding or not yet having a menstrual period, you can get pregnant. Use some form of birth control to prevent getting pregnant. Talk to your caregiver about the birth control choices that are available to you for you situation. °SEEK IMMEDIATE MEDICAL CARE IF:  °· Drainage increases from the Cesarean incision, episiotomy or tear site, or the drainage starts to smell bad. °· You soak pads with blood in 1 hour or less. °· You have severe lower abdominal pain or cramping. °· You have a bad smelling vaginal discharge. °· You have increased rather than decreased pain around stitches or swelling, redness, or hardness in area. °· You have an oral temperature above 102° F (38.9° C), not controlled by medicine. °· You have pain or redness in the calf of the leg. °· You feel sick to your stomach (nausea) and throw up (vomit) for 12 hours. °· You have sudden, severe chest pain. °· You have shortness of breath. °· You have painful or bloody urination. °·   You have visual problems.  You develop a severe headache.  An area of your breast is red and sore, and you have a fever. You may feel like you have flu symptoms.   This information is not intended to replace advice given to you by your health care provider. Make sure you discuss any questions you have with your health care provider.   Document Released: 02/04/2000 Document Revised: 05/01/2011 Document Reviewed: 08/10/2014 Elsevier Interactive Patient Education [employer].

## 2015-12-07 NOTE — Lactation Note (Signed)
This note was copied from a baby's chart. Lactation Consultation Note Follow up visit at 65 hours of age.  Mom is waiting for discharge.  Baby has had 3 breast feedings and 6 bottle feedings in the past 24 hours.  Mom reports additional attempts with breastfeeding.  Baby has had 7 voids and 1 stool in the past 24 hours with 5 stools in lifetime.  Baby is at 6% weight loss.  Mom reports initial latch on pain that resolves.  Nipples are intact and everted.  LC encouraged mom to hand express prior to latching and after feedings to apply to nipples.  MOm is aware of how to properly unlatch baby.   Mom reports offering bottles due to baby acting hungry and not staying active at the breast for the whole feeding. LC discussed how formula and bottle feedings affect latching, milk supply and engorgement.  Mom reports she plans to keep giving formula until her breasts are more full.  MOm has [place] and plans to call for assist with formula, Mom asked LC for formula at discharge and [place] explained we do not give milk at discharge. LC encouraged mom to breast feed more to not need to give formula.  LC also advised if mom is not increasing breastfeedings baby will need increase if formula volumes.  LC offered to assist with latching and mom reports recent bottle feeding.     MOm requests DEBP to be set up here now and for her to use with her pump at home.  Mom shown how to use DEBP & how to disassemble, clean, & reassemble parts. Discussed milk transitioning to larger volume, engorgement care discussed.  Encouraged frequent feedings. Mom to soften breast as needed prior to latch.    Mom aware of o/p services as needed.      Patient Name: Stephanie Park [id number] Date: 12/07/2015 Reason for consult: Follow-up assessment   Maternal Data    Feeding Feeding Type: Breast Fed Nipple Type: Slow - flow  LATCH Score/Interventions                      Lactation Tools Discussed/Used Pump Review: Setup,  frequency, and cleaning;Milk Storage   Consult Status Consult Status: Complete    Ailine Hefferan, Justine Null 12/07/2015, 10:07 AM

## 2015-12-07 NOTE — Discharge Summary (Signed)
Obstetric Discharge Summary Reason for Admission: onset of labor Prenatal Procedures: ultrasound Intrapartum Procedures: spontaneous vaginal delivery Postpartum Procedures: none Complications-Operative and Postpartum: None Hemoglobin  Date Value Ref Range Status  12/06/2015 12.6 12.0 - 15.0 g/dL Final   HCT  Date Value Ref Range Status  12/06/2015 37.2 36.0 - 46.0 % Final    Physical Exam:  General: Alert orientate, no distress Lochia: appropriate Uterine Fundus: firm DVT Evaluation: Negative Homan's sign.  Discharge Diagnoses: Term Pregnancy-delivered  Discharge Information: Date: 12/07/2015 Activity: unrestricted Diet: routine Medications: PNV and Ibuprofen Condition: stable Instructions: refer to practice specific booklet Discharge to: home  Plans on condoms till 6 weeks. [facility] Follow up in 6 week(s).   Specialty:  Obstetrics and Gynecology Contact information: [address] [place] [phone]          Newborn Data: Live born female  Birth Weight: 7 lb 14.6 oz (3590 g) APGAR: 4, 9  Home with mother.  Stephanie Park Stephanie Park 12/07/2015, 2:11 AM

## 2015-12-08 ENCOUNTER — Telehealth (HOSPITAL_COMMUNITY): Payer: Self-pay | Admitting: Lactation Services

## 2015-12-08 NOTE — Telephone Encounter (Signed)
Stephanie Park called complaining of significant pain with engorgement.  Baby is 61 days old, and isn't able to latch presently. Baby has been getting some bottles along with breastfeeding.  Mom has a DEBP, and has tried it but only gets 5-10 ml.  Engorgement treatment discussed.  Encouraged ice packs on breasts for 20 minutes between double pumping 20 minutes, and to repeat until milk starts flowing.  Stephanie Park has taken warm showers which she stated made her breasts more painful.  Cabbage leaves for short 20 minutes at a time can be helpful as well.  Appointment made for tomorrow morning 12/09/15 at 10:30 am.  To bring her pump and baby for assistance with latching.

## 2015-12-09 ENCOUNTER — Ambulatory Visit (HOSPITAL_COMMUNITY)
Admission: RE | Admit: 2015-12-09 | Discharge: 2015-12-09 | Disposition: A | Discharge status: Home / Self Care | Payer: Medicaid Other | Source: Ambulatory Visit | Attending: Obstetrics and Gynecology | Admitting: Obstetrics and Gynecology

## 2015-12-09 NOTE — Lactation Note (Signed)
Lactation Consult - Stephanie Park presents to [facility] appt. ( 1100 for 1030 am appt. ) by herself without  The baby .  Per mom baby with dad and 29 year old @ [facility] for appt.  Chief C/o - engorgement ( really bad yesterday , better today ). Per mom called [place] office yesterday, and spoke to  A LC regarding the engorgement and tx , was told to use cold cabbage leaves and ice, and feed the baby.  Per mom followed the instructions and it all improved.  Last pumped last night, and fed the baby twice during the night 10-15 mins each breast and breast soften down.  Last fed at  5 am both breast. And it is 1100 and breast borderline  engorged , nodules bilaterally both breast to arm pits. Per mom has been suing the #27 Flanges at home . ( size checked at consult and LC switched mom down to the #24 Flanges  And mom had plenty of room and was comfortable). #27 Flanges to large ( LC noted)  Per mom in the hospital and since being home has been breast and formula. Also since the being engorged baby has been receiving  More bottles until last night.  Per mom baby has had >6 wets . > 6 stools ( yellow )  See below for lactation plan of care   Mother's reason for visit:  Engorgement  - improving  Visit Type: unable to assess baby at the breast , mom came by herself  Appointment Notes: 4 days PP - engorgement  Consult:  Initial Lactation Consultant:  Stephanie Park  ________________________________________________________________________ Stephanie Park Name:  Stephanie Park Date of Birth:  12/05/2014 Pediatrician:  [facility] - DR. Joneen Caraway  Gender:  female Gestational Age: [redacted]w[redacted]d (At Birth) Birth Weight:  7 lb 14.6 oz (3590 g) Weight at Discharge:  Weight: 7 lb 7 oz (3374 g)                  Date of Discharge:  12/07/2015      Filed Weights   12/05/15 1116 12/05/15 2349 12/06/15 2320  Weight: 7 lb 14.6 oz (3590 g) 7 lb 12.3 oz (3525 g) 7 lb 7 oz (3374 g)  Last weight taken from location [facility] of  [facility]:       Weight today:   Mom did not bring baby to [facility] !  ____________________________________________________________________  Mother's Name: Stephanie Park Type of delivery:  Vaginal Delivery  Breastfeeding Experience:  2nd baby ( per mom 1st baby 8 months  Maternal Medical Conditions:  No risk prenatal or PP , LC noted excessive edema in feet and ankles  Maternal Medications: PNV , Motrin   ________________________________________________________________________  ________________________________________________________________________  Maternal Breast Assessment  Breast:  Full  To borderline engorged lateral aspects of both breast and  Underneath lateral aspects.  Nipple:  Erect Pain level:  0 Pain interventions:  Expressed breast milk  _______________________________________________________________________ Feeding Assessment/Evaluation - baby not at consult   Total amount pumped post feed: 30 ml total ( with DEBP [place] from home )   Lactation Impression:  Mom presents and seems exhausted and it surely will add to engorgement and not letting her milk down .  Mom also has been supplementing with formula from the start , adding to engorgement  LC reviewed basics of supply and demand and protecting milk supply by feeding the baby at the breast .  Per mom had breast engorgement with her  1st baby and went on to breast feed 8 months  See the plan below.    Lactation Plan of Care:  Praised mom for her efforts breast feeding and pumping  Stressed and encouraged mom to take time to rest , nap, plenty fluids , especially water , nutritious snacks and meals  Engorgement prevention and tx - Full Breast = Good Sign - best time to latch  >then full heading towards tight - over full ( may try warm wash clothes - , express off full , and latch the baby, soften 1st breast  Well and if the baby only feeds 1st breast - release 2nd breast down with hand expressing or pumping  to comfort  Engorged breast = firm or hard breast - requiring ice for 15 -20 mins - lay down while icing  Express off 1st breast if to full and feed and soften 1st breast well , off er 2nd breast if the baby only feed 1st breast - release 2nd breast . LC encouraged mom to attend the [facility] for weight checks and support.
